# Patient Record
Sex: Female | Born: 1945 | Race: White | Hispanic: No | Smoking: Never smoker
Health system: Southern US, Community
[De-identification: ages and names within clinical notes are randomized; demographics above are authoritative.]

## PROBLEM LIST (undated history)

## (undated) DIAGNOSIS — C4490 Unspecified malignant neoplasm of skin, unspecified: Secondary | ICD-10-CM

## (undated) DIAGNOSIS — Z7982 Long term (current) use of aspirin: Secondary | ICD-10-CM

## (undated) DIAGNOSIS — G4733 Obstructive sleep apnea (adult) (pediatric): Secondary | ICD-10-CM

## (undated) DIAGNOSIS — N289 Disorder of kidney and ureter, unspecified: Secondary | ICD-10-CM

## (undated) DIAGNOSIS — T8859XA Other complications of anesthesia, initial encounter: Secondary | ICD-10-CM

## (undated) DIAGNOSIS — E7800 Pure hypercholesterolemia, unspecified: Secondary | ICD-10-CM

## (undated) DIAGNOSIS — K219 Gastro-esophageal reflux disease without esophagitis: Secondary | ICD-10-CM

## (undated) DIAGNOSIS — I2510 Atherosclerotic heart disease of native coronary artery without angina pectoris: Secondary | ICD-10-CM

## (undated) DIAGNOSIS — I700 Atherosclerosis of aorta: Secondary | ICD-10-CM

## (undated) DIAGNOSIS — C50919 Malignant neoplasm of unspecified site of unspecified female breast: Secondary | ICD-10-CM

## (undated) DIAGNOSIS — N2889 Other specified disorders of kidney and ureter: Secondary | ICD-10-CM

## (undated) DIAGNOSIS — E119 Type 2 diabetes mellitus without complications: Secondary | ICD-10-CM

## (undated) DIAGNOSIS — I10 Essential (primary) hypertension: Secondary | ICD-10-CM

## (undated) DIAGNOSIS — R7303 Prediabetes: Secondary | ICD-10-CM

## (undated) DIAGNOSIS — T884XXA Failed or difficult intubation, initial encounter: Secondary | ICD-10-CM

## (undated) DIAGNOSIS — M159 Polyosteoarthritis, unspecified: Secondary | ICD-10-CM

## (undated) DIAGNOSIS — K5792 Diverticulitis of intestine, part unspecified, without perforation or abscess without bleeding: Secondary | ICD-10-CM

## (undated) DIAGNOSIS — Z8719 Personal history of other diseases of the digestive system: Secondary | ICD-10-CM

## (undated) DIAGNOSIS — M109 Gout, unspecified: Secondary | ICD-10-CM

## (undated) DIAGNOSIS — C801 Malignant (primary) neoplasm, unspecified: Secondary | ICD-10-CM

## (undated) DIAGNOSIS — N189 Chronic kidney disease, unspecified: Secondary | ICD-10-CM

## (undated) DIAGNOSIS — G4730 Sleep apnea, unspecified: Secondary | ICD-10-CM

## (undated) HISTORY — PX: CARPAL TUNNEL RELEASE: SHX101

## (undated) HISTORY — PX: OTHER SURGICAL HISTORY: SHX169

## (undated) HISTORY — PX: MANDIBLE SURGERY: SHX707

## (undated) HISTORY — PX: CHOLECYSTECTOMY: SHX55

## (undated) HISTORY — PX: WRIST SURGERY: SHX841

## (undated) HISTORY — PX: CATARACT EXTRACTION: SUR2

## (undated) HISTORY — PX: FOOT SURGERY: SHX648

---

## 2001-02-13 ENCOUNTER — Ambulatory Visit (HOSPITAL_BASED_OUTPATIENT_CLINIC_OR_DEPARTMENT_OTHER): Admission: RE | Admit: 2001-02-13 | Discharge: 2001-02-13 | Payer: Self-pay | Admitting: Orthopedic Surgery

## 2001-02-13 ENCOUNTER — Encounter (INDEPENDENT_AMBULATORY_CARE_PROVIDER_SITE_OTHER): Payer: Self-pay | Admitting: Specialist

## 2001-12-28 ENCOUNTER — Encounter: Payer: Self-pay | Admitting: Family Medicine

## 2001-12-28 ENCOUNTER — Ambulatory Visit (HOSPITAL_COMMUNITY): Admission: RE | Admit: 2001-12-28 | Discharge: 2001-12-28 | Payer: Self-pay | Admitting: Family Medicine

## 2004-05-07 ENCOUNTER — Ambulatory Visit (HOSPITAL_COMMUNITY): Admission: RE | Admit: 2004-05-07 | Discharge: 2004-05-07 | Payer: Self-pay | Admitting: Family Medicine

## 2004-09-25 ENCOUNTER — Encounter: Admission: RE | Admit: 2004-09-25 | Discharge: 2004-09-25 | Payer: Self-pay | Admitting: Family Medicine

## 2004-11-13 ENCOUNTER — Other Ambulatory Visit: Admission: RE | Admit: 2004-11-13 | Discharge: 2004-11-13 | Payer: Self-pay | Admitting: Family Medicine

## 2005-12-17 ENCOUNTER — Other Ambulatory Visit: Admission: RE | Admit: 2005-12-17 | Discharge: 2005-12-17 | Payer: Self-pay | Admitting: Family Medicine

## 2006-11-25 ENCOUNTER — Ambulatory Visit (HOSPITAL_COMMUNITY): Admission: RE | Admit: 2006-11-25 | Discharge: 2006-11-25 | Payer: Self-pay | Admitting: Family Medicine

## 2007-04-27 ENCOUNTER — Encounter: Payer: Self-pay | Admitting: Family Medicine

## 2008-04-18 ENCOUNTER — Ambulatory Visit (HOSPITAL_COMMUNITY): Admission: RE | Admit: 2008-04-18 | Discharge: 2008-04-18 | Payer: Self-pay | Admitting: Family Medicine

## 2008-05-26 LAB — CONVERTED CEMR LAB: Pap Smear: NORMAL

## 2008-05-30 ENCOUNTER — Other Ambulatory Visit: Admission: RE | Admit: 2008-05-30 | Discharge: 2008-05-30 | Payer: Self-pay | Admitting: Family Medicine

## 2008-06-05 ENCOUNTER — Encounter: Payer: Self-pay | Admitting: Family Medicine

## 2008-06-20 ENCOUNTER — Ambulatory Visit: Payer: Self-pay

## 2008-06-20 ENCOUNTER — Encounter: Payer: Self-pay | Admitting: Family Medicine

## 2008-06-27 ENCOUNTER — Encounter: Payer: Self-pay | Admitting: Family Medicine

## 2008-07-05 ENCOUNTER — Ambulatory Visit: Payer: Self-pay | Admitting: Family Medicine

## 2008-07-05 DIAGNOSIS — K219 Gastro-esophageal reflux disease without esophagitis: Secondary | ICD-10-CM | POA: Insufficient documentation

## 2008-07-05 DIAGNOSIS — I10 Essential (primary) hypertension: Secondary | ICD-10-CM | POA: Insufficient documentation

## 2008-07-19 ENCOUNTER — Telehealth: Payer: Self-pay | Admitting: Family Medicine

## 2008-08-22 ENCOUNTER — Ambulatory Visit: Payer: Self-pay | Admitting: Family Medicine

## 2008-11-15 ENCOUNTER — Ambulatory Visit: Payer: Self-pay | Admitting: Family Medicine

## 2008-11-15 DIAGNOSIS — B351 Tinea unguium: Secondary | ICD-10-CM | POA: Insufficient documentation

## 2008-12-20 ENCOUNTER — Ambulatory Visit: Payer: Self-pay | Admitting: Family Medicine

## 2008-12-20 DIAGNOSIS — E039 Hypothyroidism, unspecified: Secondary | ICD-10-CM | POA: Insufficient documentation

## 2008-12-21 LAB — CONVERTED CEMR LAB
ALT: 22 units/L (ref 0–35)
AST: 27 units/L (ref 0–37)
Albumin: 3.9 g/dL (ref 3.5–5.2)
Alkaline Phosphatase: 87 units/L (ref 39–117)
BUN: 16 mg/dL (ref 6–23)
Bilirubin, Direct: 0.1 mg/dL (ref 0.0–0.3)
Calcium: 9 mg/dL (ref 8.4–10.5)
Chloride: 103 meq/L (ref 96–112)
Cholesterol: 141 mg/dL (ref 0–200)
Creatinine, Ser: 1 mg/dL (ref 0.4–1.2)
GFR calc non Af Amer: 59.26 mL/min (ref >60)
LDL Cholesterol: 85 mg/dL (ref 0–99)
TSH: 2.43 microintl units/mL (ref 0.35–5.50)
Total Protein: 6.6 g/dL (ref 6.0–8.3)
Triglycerides: 129 mg/dL (ref 0.0–149.0)

## 2009-01-17 ENCOUNTER — Telehealth: Payer: Self-pay | Admitting: Family Medicine

## 2009-02-14 ENCOUNTER — Ambulatory Visit: Payer: Self-pay | Admitting: Family Medicine

## 2009-02-14 DIAGNOSIS — L988 Other specified disorders of the skin and subcutaneous tissue: Secondary | ICD-10-CM | POA: Insufficient documentation

## 2009-03-02 ENCOUNTER — Telehealth: Payer: Self-pay | Admitting: Family Medicine

## 2009-03-28 ENCOUNTER — Ambulatory Visit: Payer: Self-pay | Admitting: Family Medicine

## 2009-03-28 DIAGNOSIS — L989 Disorder of the skin and subcutaneous tissue, unspecified: Secondary | ICD-10-CM | POA: Insufficient documentation

## 2009-03-28 DIAGNOSIS — F4321 Adjustment disorder with depressed mood: Secondary | ICD-10-CM | POA: Insufficient documentation

## 2009-03-28 DIAGNOSIS — R4589 Other symptoms and signs involving emotional state: Secondary | ICD-10-CM | POA: Insufficient documentation

## 2009-03-31 ENCOUNTER — Encounter: Payer: Self-pay | Admitting: Family Medicine

## 2009-04-28 ENCOUNTER — Telehealth: Payer: Self-pay | Admitting: Family Medicine

## 2009-07-23 ENCOUNTER — Ambulatory Visit: Payer: Self-pay | Admitting: Internal Medicine

## 2009-09-19 ENCOUNTER — Ambulatory Visit: Payer: Self-pay | Admitting: Family Medicine

## 2009-09-21 ENCOUNTER — Ambulatory Visit: Payer: Self-pay

## 2009-09-22 ENCOUNTER — Encounter: Payer: Self-pay | Admitting: Family Medicine

## 2009-09-27 ENCOUNTER — Ambulatory Visit: Payer: Self-pay | Admitting: Podiatry

## 2009-10-02 ENCOUNTER — Encounter: Payer: Self-pay | Admitting: Family Medicine

## 2009-10-05 ENCOUNTER — Telehealth: Payer: Self-pay | Admitting: Family Medicine

## 2009-10-09 ENCOUNTER — Encounter: Payer: Self-pay | Admitting: Family Medicine

## 2009-10-25 ENCOUNTER — Encounter: Payer: Self-pay | Admitting: Family Medicine

## 2009-11-17 ENCOUNTER — Ambulatory Visit: Payer: Self-pay | Admitting: Family Medicine

## 2009-11-17 ENCOUNTER — Other Ambulatory Visit: Admission: RE | Admit: 2009-11-17 | Discharge: 2009-11-17 | Payer: Self-pay | Admitting: Family Medicine

## 2009-11-17 DIAGNOSIS — M109 Gout, unspecified: Secondary | ICD-10-CM | POA: Insufficient documentation

## 2009-11-21 ENCOUNTER — Telehealth: Payer: Self-pay | Admitting: Family Medicine

## 2009-11-24 ENCOUNTER — Encounter (INDEPENDENT_AMBULATORY_CARE_PROVIDER_SITE_OTHER): Payer: Self-pay | Admitting: *Deleted

## 2009-11-24 LAB — CONVERTED CEMR LAB: Pap Smear: NEGATIVE

## 2009-11-28 ENCOUNTER — Telehealth (INDEPENDENT_AMBULATORY_CARE_PROVIDER_SITE_OTHER): Payer: Self-pay | Admitting: *Deleted

## 2009-11-28 ENCOUNTER — Ambulatory Visit: Payer: Self-pay | Admitting: Family Medicine

## 2009-11-28 ENCOUNTER — Encounter: Payer: Self-pay | Admitting: Family Medicine

## 2009-12-06 ENCOUNTER — Encounter (INDEPENDENT_AMBULATORY_CARE_PROVIDER_SITE_OTHER): Payer: Self-pay | Admitting: *Deleted

## 2009-12-12 ENCOUNTER — Telehealth: Payer: Self-pay | Admitting: Family Medicine

## 2009-12-26 ENCOUNTER — Ambulatory Visit: Payer: Self-pay | Admitting: Family Medicine

## 2010-01-02 LAB — CONVERTED CEMR LAB
ALT: 24 units/L (ref 0–35)
AST: 23 units/L (ref 0–37)
Albumin: 4 g/dL (ref 3.5–5.2)
Alkaline Phosphatase: 73 units/L (ref 39–117)
BUN: 19 mg/dL (ref 6–23)
CO2: 30 meq/L (ref 19–32)
Chloride: 104 meq/L (ref 96–112)
GFR calc non Af Amer: 62.68 mL/min (ref >60)
Glucose, Bld: 93 mg/dL (ref 70–99)
HDL: 36.5 mg/dL — ABNORMAL LOW (ref >39.00)
Potassium: 4 meq/L (ref 3.5–5.1)
Sodium: 143 meq/L (ref 135–145)
TSH: 3.38 microintl units/mL (ref 0.35–5.50)

## 2010-01-03 ENCOUNTER — Telehealth: Payer: Self-pay | Admitting: Family Medicine

## 2010-02-09 ENCOUNTER — Ambulatory Visit: Payer: Self-pay | Admitting: Family Medicine

## 2010-02-09 DIAGNOSIS — IMO0002 Reserved for concepts with insufficient information to code with codable children: Secondary | ICD-10-CM | POA: Insufficient documentation

## 2010-02-16 ENCOUNTER — Telehealth: Payer: Self-pay | Admitting: Family Medicine

## 2010-02-26 ENCOUNTER — Ambulatory Visit: Payer: Self-pay | Admitting: Family Medicine

## 2010-02-26 DIAGNOSIS — M25569 Pain in unspecified knee: Secondary | ICD-10-CM

## 2010-04-03 ENCOUNTER — Telehealth: Payer: Self-pay | Admitting: Family Medicine

## 2010-04-05 ENCOUNTER — Ambulatory Visit: Payer: Self-pay | Admitting: Family Medicine

## 2010-04-05 DIAGNOSIS — E7800 Pure hypercholesterolemia, unspecified: Secondary | ICD-10-CM | POA: Insufficient documentation

## 2010-04-06 ENCOUNTER — Telehealth: Payer: Self-pay | Admitting: Family Medicine

## 2010-04-06 ENCOUNTER — Encounter (INDEPENDENT_AMBULATORY_CARE_PROVIDER_SITE_OTHER): Payer: Self-pay | Admitting: *Deleted

## 2010-04-10 LAB — CONVERTED CEMR LAB
Alkaline Phosphatase: 71 units/L (ref 39–117)
Bilirubin, Direct: 0.1 mg/dL (ref 0.0–0.3)
Calcium: 10.3 mg/dL (ref 8.4–10.5)
Chloride: 102 meq/L (ref 96–112)
Cholesterol: 229 mg/dL — ABNORMAL HIGH (ref 0–200)
Creatinine, Ser: 1 mg/dL (ref 0.4–1.2)
Direct LDL: 144 mg/dL
Sodium: 140 meq/L (ref 135–145)
Total Bilirubin: 0.7 mg/dL (ref 0.3–1.2)
Total CHOL/HDL Ratio: 6
Triglycerides: 268 mg/dL — ABNORMAL HIGH (ref 0.0–149.0)
VLDL: 53.6 mg/dL — ABNORMAL HIGH (ref 0.0–40.0)

## 2010-04-12 ENCOUNTER — Telehealth: Payer: Self-pay | Admitting: Family Medicine

## 2010-04-25 ENCOUNTER — Encounter: Payer: Self-pay | Admitting: Family Medicine

## 2010-05-14 ENCOUNTER — Ambulatory Visit: Payer: Self-pay | Admitting: Family Medicine

## 2010-05-14 DIAGNOSIS — M5430 Sciatica, unspecified side: Secondary | ICD-10-CM | POA: Insufficient documentation

## 2010-05-14 DIAGNOSIS — M5431 Sciatica, right side: Secondary | ICD-10-CM | POA: Insufficient documentation

## 2010-05-15 LAB — CONVERTED CEMR LAB: Uric Acid, Serum: 6.4 mg/dL (ref 2.4–7.0)

## 2010-08-19 HISTORY — PX: OTHER SURGICAL HISTORY: SHX169

## 2010-08-27 ENCOUNTER — Telehealth: Payer: Self-pay | Admitting: Family Medicine

## 2010-08-28 ENCOUNTER — Telehealth: Payer: Self-pay | Admitting: Family Medicine

## 2010-09-05 ENCOUNTER — Telehealth: Payer: Self-pay | Admitting: Family Medicine

## 2010-09-20 NOTE — Progress Notes (Signed)
Summary: new dosing guidlines for colcrys  Phone Note Call from Patient   Caller: Pharmacy- [phone] Call For: Theresa Nora MD Summary of Call:  pharmacy states that new dosing guidlines avised are 2 tabs by mouth then follow w/ 1 tab one hour later not to repeat every 2 hours. Pharmacy wants to know if you want to change directions. Please advise.  Initial call taken by: Melody Comas,  August 28, 2010 3:47 PM  Follow-up for Phone Call        Yes please change directions.[name] also change in EMR.  Follow-up by: Theresa Nora MD,  August 28, 2010 5:06 PM  Additional Follow-up for Phone Call Additional follow up Details #1::        all taken care off.[name] CMA   Additional Follow-up by: Benny Lennert CMA Duncan Dull),  August 28, 2010 5:09 PM    New/Updated Medications: COLCRYS 0.6 MG TABS (COLCHICINE) 2 tabs by mouth now, then 1 by hour  Prior Medications: HYDROCHLOROTHIAZIDE 25 MG TABS (HYDROCHLOROTHIAZIDE) Take 1 tablet by mouth once a day LISINOPRIL 10 MG TABS (LISINOPRIL) Take 1 tablet by mouth once a day CELEBREX 200 MG CAPS (CELECOXIB) 1 tab by mouth daily MULTIVITAMINS   TABS (MULTIPLE VITAMIN) Take 1 tablet by mouth once a day VITAMIN D 1000 UNIT  TABS (CHOLECALCIFEROL) 2000 i.u. once daily VITAMIN B-12 500 MCG  TABS (CYANOCOBALAMIN) 1000 mg. once daily [facility] STOOL SOFTENER/LAXATIVE 8.6-50 MG TABS (SENNOSIDES-DOCUSATE SODIUM) Take 2  tablets by mouth once a day MUCINEX DM MAXIMUM STRENGTH 60-1200 MG XR12H-TAB (DEXTROMETHORPHAN-GUAIFENESIN) Take 1 tablet by mouth once a day as needed. TERBINAFINE HCL 250 MG TABS (TERBINAFINE HCL) 1 tab by mouth daily x 12 weeks ALLOPURINOL 300 MG TABS (ALLOPURINOL) Take 1 tablet by mouth once a day FISH OIL 1000 MG CAPS (OMEGA-3 FATTY ACIDS) 4000mg  divided daily CYCLOBENZAPRINE HCL 10 MG TABS (CYCLOBENZAPRINE HCL) 1/2 to 1 tab by mouth at bedtime as needed muscle spasm GABAPENTIN 300 MG CAPS (GABAPENTIN) 1 by mouth three times a  day Current Allergies: No known allergies

## 2010-09-20 NOTE — Progress Notes (Signed)
Summary: colcrys  Phone Note Refill Request Message from:  [employer] on August 27, 2010 9:17 AM  Refills Requested: Medication #1:  colcrys 0.6   Supply Requested: 1 month   Notes: 2 tabs by mouth now, then 1 by mouth q 2 hours until pain improved, then two times a day until pain resolution [facility] [address]   Method Requested: Electronic Initial call taken by: Benny Lennert CMA Duncan Dull),  August 27, 2010 9:19 AM  Follow-up for Phone Call        Okay to refill as requested.. please enter in EMR.  Follow-up by: Kerby Nora MD,  August 27, 2010 10:52 PM    New/Updated Medications: COLCRYS 0.6 MG TABS (COLCHICINE) 2 tabs by mouth now, then 1 by mouth q 2 hours until pain improved, then two times a day until pain resolution Prescriptions: COLCRYS 0.6 MG TABS (COLCHICINE) 2 tabs by mouth now, then 1 by mouth q 2 hours until pain improved, then two times a day until pain resolution  #30 x 0   Entered by:   Benny Lennert CMA (AAMA)   Authorized by:   Kerby Nora MD   Signed by:   Benny Lennert CMA (AAMA) on 08/28/2010   Method used:   Electronically to        [facility] [address] [address] (retail)       [address]       [place], [place]  [address]       Ph: [phone]       Fax: [phone]   RxID:   [id number]   Prior Medications: HYDROCHLOROTHIAZIDE 25 MG TABS (HYDROCHLOROTHIAZIDE) Take 1 tablet by mouth once a day LISINOPRIL 10 MG TABS (LISINOPRIL) Take 1 tablet by mouth once a day CELEBREX 200 MG CAPS (CELECOXIB) 1 tab by mouth daily MULTIVITAMINS   TABS (MULTIPLE VITAMIN) Take 1 tablet by mouth once a day VITAMIN D 1000 UNIT  TABS (CHOLECALCIFEROL) 2000 i.u. once daily VITAMIN B-12 500 MCG  TABS (CYANOCOBALAMIN) 1000 mg. once daily [facility] STOOL SOFTENER/LAXATIVE 8.6-50 MG TABS (SENNOSIDES-DOCUSATE SODIUM) Take 2  tablets by mouth once a day MUCINEX DM MAXIMUM STRENGTH 60-1200 MG XR12H-TAB (DEXTROMETHORPHAN-GUAIFENESIN) Take 1 tablet by  mouth once a day as needed. TERBINAFINE HCL 250 MG TABS (TERBINAFINE HCL) 1 tab by mouth daily x 12 weeks ALLOPURINOL 300 MG TABS (ALLOPURINOL) Take 1 tablet by mouth once a day FISH OIL 1000 MG CAPS (OMEGA-3 FATTY ACIDS) 4000mg  divided daily CYCLOBENZAPRINE HCL 10 MG TABS (CYCLOBENZAPRINE HCL) 1/2 to 1 tab by mouth at bedtime as needed muscle spasm GABAPENTIN 300 MG CAPS (GABAPENTIN) 1 by mouth three times a day Current Allergies: No known allergies

## 2010-09-20 NOTE — Assessment & Plan Note (Signed)
Summary: HURTING IN BACK AND DOWN LEGS   Vital Signs:  Patient profile:   65 year old female Weight:      189 pounds Temp:     98.4 degrees F oral Pulse rate:   84 / minute Pulse rhythm:   regular BP sitting:   118 / 76  (right arm) Cuff size:   regular  Vitals Entered By: Lowella Petties CMA (February 09, 2010 10:50 AM) CC: Low back pain, radiating down right leg x 3 days, Back Pain   History of Present Illness: 65 year old female:  Right sided radiculopathy Has been having some intermittent back pain for a longg   Planted a tree.    Back Pain History:      The patient's back pain has been present for < 6 weeks.  The pain is located in the lower back region and does radiate below the knees.  She states this is not work related.  She states that she has had a prior history of back pain.  The patient has not had any recent physical therapy for her back pain.  The following makes the back pain better: stretching, heating pad.  The following makes the back pain worse: flexion and elevation of leg.        Description of injury in patient's own words:  none.    Critical Exclusionary Diagnosis Criteria (CEDC) for Back Pain:      The patient denies a history of previous trauma.  She has no prior history of spinal surgery.  There are no symptoms to suggest cauda equina or psychosocial factors for back pain.  Cancer risk factors include age >50 yrs with new back pain.    Allergies: No Known Drug Allergies  Past History:  Past medical, surgical, family and social histories (including risk factors) reviewed, and no changes noted (except as noted below).  Past Medical History: Reviewed history from 07/05/2008 and no changes required. GERD Hypertension  Past Surgical History: Reviewed history from 07/05/2008 and no changes required. facial/jaw cyst surgery x 4 1990 Cholecystectomy  Family History: Reviewed history from 07/05/2008 and no changes required. father: CVA, CAD s/p stents  no early MI, high chol mother:ovarian cancer, HTN sister: DM, HTN PGF: DM PGM: CAD MGM: CAD MGF: CAD  Social History: Reviewed history from 03/28/2009 and no changes required. 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    Physical Exam  General:  GEN: Well-developed,well-nourished,in no acute distress; alert,appropriate and cooperative throughout examination HEENT: Normocephalic and atraumatic without obvious abnormalities. No apparent alopecia or balding. Ears, externally no deformities PULM: Breathing comfortably in no respiratory distress EXT: No clubbing, cyanosis, or edema PSYCH: Normally interactive. Cooperative during the interview. Pleasant. Friendly and conversant. Not anxious or depressed appearing. Normal, full affect.  Msk:  Normal Greater trochanteric bursae Full hip ROM Negative Pearlean Brownie + Reverse Faber Sciatic Notches: nt Sensation to [employer] WNL Sensation to pinpricnk WNL DTR 2+ knee and ankle no clonus DP and PT pulses are normal B   Low Back Pain Physical Exam:    Inspection-deformity:  No    Palpation-spinal tenderness:   No    Motor Exam/Strength:         Left Ankle Dorsiflexion (L5,L4):     normal       Left Great Toe Dorsiflexion (L5,L4):     normal       Left Heel Walk (L5,some L4):     normal       Left Single Squat & Rise-Quads (L4):   normal       Left Toe Walk-calf (S1):       normal       Right Ankle Dorsiflexion (L5,L4):     normal       Right Great Toe Dorsiflexion (L5,L4):       normal       Right Heel Walk  (L5,some L4):     normal       Right Single Squat & Rise Quads (L4):   normal       Right Toe Walk-calf (S1):       normal    Sensory Exam/Pinprick:        Left Medial Foot (L4):   normal       Left Dorsal Foot (L5):   normal       Left Lateral Foot (S1):   normal       Right Medial Foot (L4):   normal       Right Dorsal Foot (L5):   normal       Right Lateral Foot (S1):   normal    Reflexes:        Left Knee Jerk (L4):     normal       Left Ankle Reflex (S1):   normal       Right Knee Jerk:     normal       Right Ankle Reflex (S1):   normal    Straight Leg Raise (SLR):       Left Straight Leg Raise (SLR):   negative       Right Straight Leg Raise (SLR):   negative   Impression & Recommendations:  Problem # 1:  BACK PAIN, LUMBAR, WITH RADICULOPATHY (ICD-724.4) Assessment New suspect discogenic vs. small spur with foraminal impingement on R  cont celebrex, zanaflex, tramadol as needed  heating pad massage  f/u if not better  Her updated medication list for this problem includes:    Celebrex 200 Mg Caps (Celecoxib) .[name]... 1 tab by mouth daily    Aspirin 81 Mg Tabs (Aspirin) .[name]... Take 1 tablet by mouth once a day    Tizanidine Hcl 4 Mg Tabs (Tizanidine hcl) .[name]... 1 by mouth at bedtime as needed back    Tramadol Hcl 50 Mg Tabs (Tramadol hcl) .[name]... 1 by mouth 4 times daily as needed pain  Complete Medication List: 1)  Hydrochlorothiazide 25 Mg Tabs (Hydrochlorothiazide) .... Take 1 tablet by mouth once a day 2)  Lisinopril 10 Mg Tabs (Lisinopril) .... Take 1 tablet by mouth once a day 3)  Celebrex 200 Mg Caps (Celecoxib) .[name].. 1 tab by mouth daily 4)  Aspirin 81 Mg Tabs (Aspirin) .... Take 1 tablet by mouth once a day 5)  Multivitamins Tabs (Multiple vitamin) .... Take 1 tablet by mouth once a day 6)  Calcium Carbonate-vitamin D 600-400 Mg-unit Tabs (Calcium carbonate-vitamin d) .... Take 1 tablet by mouth once a day 7)  Vitamin D 1000 Unit Tabs (Cholecalciferol) .... 2000 i.u.  once daily 8)  Vitamin B-12 500 Mcg  Tabs (Cyanocobalamin) .[name].. 1000 mg. once daily 9)  [facility] Stool Softener/laxative 8.6-50 Mg Tabs (Sennosides-docusate sodium) .... Take 2  tablets by mouth once a day 10)  Mucinex Dm Maximum Strength 60-1200 Mg Xr12h-tab (Dextromethorphan-guaifenesin) .... Take 1 tablet by mouth once a day as needed. 11)  Terbinafine Hcl 250 Mg Tabs (Terbinafine hcl) .[name].. 1 tab by mouth daily x 12 weeks 12)  Omega Red 300mg   .... Take one tablet by mouth daily 13)  Colcrys 0.6 Mg Tabs (Colchicine) .... 2 tab by mouth x 1 then 1 tab by mouth every 2 hpours until paingone, have diarrhea or reach max of 10 pills in 24 hours. 14)  Allopurinol 100 Mg Tabs (Allopurinol) .[name].. 1 by mouth daily 15)  Tizanidine Hcl 4 Mg Tabs (Tizanidine hcl) .[name].. 1 by mouth at bedtime as needed back 16)  Tramadol Hcl 50 Mg Tabs (Tramadol hcl) .[name].. 1 by mouth 4 times daily as needed pain Prescriptions: TRAMADOL HCL 50 MG  TABS (TRAMADOL HCL) 1 by mouth 4 times daily as needed pain  #40 x 0   Entered and Authorized by:   Hannah Beat MD   Signed by:   Hannah Beat MD on 02/09/2010   Method used:   Electronically to        [facility] (retail)       [address]       [place], [place]  [address]       Ph: [phone]       Fax: [phone]   RxID:   [id number] TIZANIDINE HCL 4 MG TABS (TIZANIDINE HCL) 1 by mouth at bedtime as needed back  #30 x 1   Entered and Authorized by:   Hannah Beat MD   Signed by:   Hannah Beat MD on 02/09/2010   Method used:   Electronically to        [facility] (retail)       [address]       [place], [place]  [address]       Ph: [phone]       Fax: [phone]   RxID:   [id number]   Prior Medications (reviewed today): HYDROCHLOROTHIAZIDE 25 MG TABS (HYDROCHLOROTHIAZIDE) Take 1 tablet by mouth once a day LISINOPRIL 10 MG TABS (LISINOPRIL) Take 1 tablet by mouth once a  day CELEBREX 200 MG CAPS (CELECOXIB) 1 tab by mouth daily ASPIRIN 81 MG  TABS (ASPIRIN) Take 1 tablet by mouth once a day MULTIVITAMINS   TABS (MULTIPLE VITAMIN) Take 1 tablet by mouth once a day CALCIUM CARBONATE-VITAMIN D 600-400 MG-UNIT  TABS (CALCIUM CARBONATE-VITAMIN D) Take 1 tablet by mouth once a day VITAMIN D 1000 UNIT  TABS (CHOLECALCIFEROL) 2000 i.u. once daily VITAMIN B-12 500 MCG  TABS (CYANOCOBALAMIN) 1000 mg. once daily [facility] STOOL SOFTENER/LAXATIVE 8.6-50 MG TABS (SENNOSIDES-DOCUSATE SODIUM) Take 2  tablets by mouth once a day MUCINEX DM MAXIMUM STRENGTH 60-1200 MG XR12H-TAB (DEXTROMETHORPHAN-GUAIFENESIN) Take 1 tablet by mouth once a day as needed. TERBINAFINE HCL 250 MG TABS (TERBINAFINE HCL) 1 tab by mouth daily x 12 weeks OMEGA RED 300MG  () take one tablet by mouth daily COLCRYS 0.6 MG TABS (COLCHICINE) 2 tab by mouth x 1 then 1 tab by mouth every 2 hpours until paingone, have diarrhea or reach max of 10 pills in 24 hours. ALLOPURINOL 100 MG TABS (ALLOPURINOL) 1 by  mouth daily Current Allergies: No known allergies

## 2010-09-20 NOTE — Letter (Signed)
Summary: [facility]   Imported By: Lanelle Bal 11/29/2009 12:19:35  _____________________________________________________________________  External Attachment:    Type:   Image     Comment:   External Document

## 2010-09-20 NOTE — Progress Notes (Signed)
Summary: Clarification for Celebrex and Indomethacin  Phone Note From Pharmacy   Caller: [facility]* Call For: Dr. Ermalene Searing  Summary of Call: Received a fax form from [facility] requesting clarification on Celebrex and Indomethacin.  Form in your IN box.  Please advise. Initial call taken by: Linde Gillis CMA [facility]),  November 21, 2009 11:55 AM

## 2010-09-20 NOTE — Assessment & Plan Note (Signed)
Summary: SWOLLEN RIGHT KNEE/CLE   Vital Signs:  Patient profile:   65 year old female Weight:      190 pounds Temp:     98.5 degrees F oral Pulse rate:   76 / minute Pulse rhythm:   regular BP sitting:   142 / 78  (left arm) Cuff size:   regular  Vitals Entered By: Lowella Petties CMA (February 26, 2010 9:05 AM) CC: Right knee is swollen   History of Present Illness: 65 year old with R knee pain:  Has an effusion.   the patient is a pleasant 64 year old female with a history of gout presents with right knee it has been there barely getting worse over the last 2 weeks. She has no inciting event or injury or trauma. No mechanical symptoms, no symptomatic giving way, the patient does have a large effusion. No mechanical buckling. Patient is having difficulty going up and down stairs  REVIEW OF SYSTEMS  GEN: No systemic complaints, no fevers, chills, sweats, or other acute illnesses MSK: Detailed in the HPI GI: tolerating PO intake without difficulty Neuro: No numbness, parasthesias, or tingling associated. Otherwise the pertinent positives of the ROS are noted above.    GEN: Well-developed,well-nourished,in no acute distress; alert,appropriate and cooperative throughout examination HEENT: Normocephalic and atraumatic without obvious abnormalities. No apparent alopecia or balding. Ears, externally no deformities PULM: Breathing comfortably in no respiratory distress EXT: No clubbing, cyanosis, or edema PSYCH: Normally interactive. Cooperative during the interview. Pleasant. Friendly and conversant. Not anxious or depressed appearing. Normal, full affect.   right knee: Full extension, mildly tender on the medial joint line.  Patellar mobility is poor. Otherwise bony anatomy is nontender throughout. Stable MCL and LCL. Negative Lachman exam and negative anterior and posterior drawer exam.  Large ballotable effusion. Flexion is limited to approximately 95 and all other meniscal pathology  testing is equivocal given large effusion.  Allergies: No Known Drug Allergies  Past History:  Past medical, surgical, family and social histories (including risk factors) reviewed, and no changes noted (except as noted below).  Past Medical History: GERD Hypertension Gout  Past Surgical History: Reviewed history from 07/05/2008 and no changes required. facial/jaw cyst surgery x 4 1990 Cholecystectomy  Family History: Reviewed history from 07/05/2008 and no changes required. father: CVA, CAD s/p stents no early MI, high chol mother:ovarian cancer, HTN sister: DM, HTN PGF: DM PGM: CAD MGM: CAD MGF: CAD  Social History: Reviewed history from 03/28/2009 and no changes required. 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  The patient could have flared  some osteoarthritis given her recent back pain, however  certainly the joint aspirate is consistent with gouty pathology. When she becomes asymptomatic, I would increase her allopurinol. For now, celebrex, ice, colchicine, knee aspiration, and injection  Knee Aspiration and Injection, R Patient verbally consented; risks, benefits, and alternatives explained. Patient prepped with betadine. Ethyl chloride for anesthesia. 10 cc of 1% Lidocaine used in wheal then injected Subcutaneous fashion with 27 gauge needle on lateral approach. Under sterilne conditions, 18 gauge needle used via lateral approach to aspirate 40 cc of yellow, cloudy fluid. Then 9 cc of Marcaine 0.5% and 1 cc of Kenalog 40 mg injected. Tolerated well, decreased pain,  no complications.   Her updated medication list for this problem  includes:    Celebrex 200 Mg Caps (Celecoxib) .[name]... 1 tab by mouth daily    Colcrys 0.6 Mg Tabs (Colchicine) .[name]... 2 tab by mouth x 1 then 1 tab by mouth every 2 hpours until paingone, have diarrhea or reach max of 10 pills in 24 hours.    Allopurinol 100 Mg Tabs (Allopurinol) .[name]... 1 by mouth daily    Colcrys 0.6 Mg Tabs (Colchicine) .[name]... 2 tabs by mouth now, then 1 by mouth q 2 hours until pain improved, then two times a day until pain resolution  Orders: Joint Aspirate / Injection, Large (20610) Kenalog 10mg  (4units) (J3301)  Problem # 2:  KNEE PAIN, RIGHT (BJY-782.95) Assessment: New  Her updated medication list for this problem includes:    Celebrex 200 Mg Caps (Celecoxib) .[name]... 1 tab by mouth daily    Aspirin 81 Mg Tabs (Aspirin) .[name]... Take 1 tablet by mouth once a day    Tizanidine Hcl 4 Mg Tabs (Tizanidine hcl) .[name]... 1 by mouth at bedtime as needed back    Tramadol Hcl 50 Mg Tabs (Tramadol hcl) .[name]... 1 by mouth 4 times daily as needed pain  Orders: Joint Aspirate / Injection, Large (20610) Kenalog 10mg  (4units) (J3301)  Complete Medication List: 1)  Hydrochlorothiazide 25 Mg Tabs (Hydrochlorothiazide) .... Take 1 tablet by mouth once a day 2)  Lisinopril 10 Mg Tabs (Lisinopril) .... Take 1 tablet by mouth once a day 3)  Celebrex 200 Mg Caps (Celecoxib) .[name].. 1 tab by mouth daily 4)  Aspirin 81 Mg Tabs (Aspirin) .... Take 1 tablet by mouth once a day 5)  Multivitamins Tabs (Multiple vitamin) .... Take 1 tablet by mouth once a day 6)  Calcium Carbonate-vitamin D 600-400 Mg-unit Tabs (Calcium carbonate-vitamin d) .... Take 1 tablet by mouth once a day 7)  Vitamin D 1000 Unit Tabs (Cholecalciferol) .... 2000 i.u. once daily 8)  Vitamin B-12 500 Mcg Tabs (Cyanocobalamin) .[name].. 1000 mg. once daily 9)  Cvs Stool Softener/laxative 8.6-50 Mg Tabs (Sennosides-docusate sodium) .... Take 2  tablets by mouth once a day 10)  Mucinex Dm Maximum Strength 60-1200 Mg Xr12h-tab  (Dextromethorphan-guaifenesin) .... Take 1 tablet by mouth once a day as needed. 11)  Terbinafine Hcl 250 Mg Tabs (Terbinafine hcl) .[name].. 1 tab by mouth daily x 12 weeks 12)  Omega Red 300mg   .... Take one tablet by mouth daily 13)  Colcrys 0.6 Mg Tabs (Colchicine) .... 2 tab by mouth x 1 then 1 tab by mouth every 2 hpours until paingone, have diarrhea or reach max of 10 pills in 24 hours. 14)  Allopurinol 100 Mg Tabs (Allopurinol) .[name].. 1 by mouth daily 15)  Tizanidine Hcl 4 Mg Tabs (Tizanidine hcl) .[name].. 1 by mouth at bedtime as needed back 16)  Tramadol Hcl 50 Mg Tabs (Tramadol hcl) .[name].. 1 by mouth 4 times daily as needed pain 17)  Colcrys 0.6 Mg Tabs (Colchicine) .... 2 tabs by mouth now, then 1 by mouth q 2 hours until pain improved, then two times a day until pain resolution Prescriptions: COLCRYS 0.6 MG TABS (COLCHICINE) 2 tabs by mouth now, then 1 by mouth q 2 hours until pain improved, then two times a day until pain resolution  #30 x 0   Entered and Authorized by:   Hannah Beat MD   Signed by:   Hannah Beat MD on 02/26/2010   Method used:   Print then Give  to Patient   RxID:   [id number]

## 2010-09-20 NOTE — Progress Notes (Signed)
Summary: regarding gout meds  Phone Note Call from Patient   Caller: Patient Call For: Dr. Patsy Lager Summary of Call: Pt states she is over the gout flare up in her left foot.  She has been taking colcrys for the flare, as well as allopurinol every day.  She is asking if we were going to change her gout medicines, pharmacist told her she can take colcrys everyday to prevent flares.  Please advise.  I told her I didnt think she was to take allorpurinol during flares, but I would ask.  [facility]ses [facility] [place]. Initial call taken by: Lowella Petties CMA,  April 12, 2010 3:00 PM  Follow-up for Phone Call        Do not alter allopurinol or d/c during flares  I would recommend increasing allopurinol vs. uloric if at baseline and asymptomatic - continuity, i would rather have Dr. B make this decision with her pt. Follow-up by: Hannah Beat MD,  April 12, 2010 3:11 PM  Additional Follow-up for Phone Call Additional follow up Details #1::        Advised pt, will send note to Dr. Ermalene Searing, pt knows she is out of the office until tuesday.           Lowella Petties CMA  April 12, 2010 3:16 PM     Additional Follow-up for Phone Call Additional follow up Details #2::    The main question is...is she still ahving a flare???? Her  uric acid is very high. Once gout flare is over we will need to increase allopurinol, but not until she is completely assymptomatic. Please let me know whther fare over or not. Follow-up by: Kerby Nora MD,  April 12, 2010 11:01 PM  Additional Follow-up for Phone Call Additional follow up Details #3:: Details for Additional Follow-up Action Taken: Patient says gout flare completely gone now would [place] [facility] CMA Duncan Dull),  April 13, 2010 8:13 AM  Sorry missed that in all the notes, but saw it on second read.  HAve her continue colchrys and increase allopurinol to 300 mg daily.Marland KitchenMarland KitchenI will send in new rx. Recheck uric acid in 4-6 weeks. Dx 274.9  patient  advised and lab appt made Additional Follow-up by: Benny Lennert CMA Duncan Dull),  April 16, 2010 3:41 PM  New/Updated Medications: ALLOPURINOL 300 MG TABS (ALLOPURINOL) Take 1 tablet by mouth once a day Prescriptions: ALLOPURINOL 300 MG TABS (ALLOPURINOL) Take 1 tablet by mouth once a day  #30 x 11   Entered and Authorized by:   Kerby Nora MD   Signed by:   Kerby Nora MD on 04/16/2010   Method used:   Electronically to        [facility] (retail)       [address]       [place], [place]  [address]       Ph: [phone]       Fax: [phone]   RxID:   [id number]

## 2010-09-20 NOTE — Assessment & Plan Note (Signed)
Summary: CLEARANCE FOR FOOT SURGERY/CLE   Vital Signs:  Patient profile:   65 year old female Height:      63 inches Weight:      190.2 pounds BMI:     33.81 Temp:     98.1 degrees F oral Pulse rate:   80 / minute Pulse rhythm:   regular BP sitting:   120 / 76  (left arm) Cuff size:   regular  Vitals Entered By: Benny Lennert CMA Duncan Dull) (September 19, 2009 8:53 AM)  History of Present Illness: Chief complaint clearance for foot surgery  PREOP EVAL:   Next week has procedure planned for skin lesion biopsy and removal on left great toe. Podiatrist was unsure what area was as well. At [facility] hospital... not clearly going to have intubation.   HTN well controled at home.     Problems Prior to Update: 1)  Mourning  (ICD-309.0) 2)  Skin Lesion  (ICD-709.9) 3)  Other Specified Disorder of Skin  (ICD-709.8) 4)  Unspecified Hypothyroidism  (ICD-244.9) 5)  Screening For Lipoid Disorders  (ICD-V77.91) 6)  Onychomycosis, Bilateral  (ICD-110.1) 7)  Hypertension  (ICD-401.9) 8)  Gerd  (ICD-530.81) 9)  ? of Rheumatoid Arthritis  (ICD-714.0)  Current Medications (verified): 1)  Hydrochlorothiazide 25 Mg Tabs (Hydrochlorothiazide) .... Take 1 Tablet By Mouth Once A Day 2)  Lisinopril 10 Mg Tabs (Lisinopril) .... Take 1 Tablet By Mouth Once A Day 3)  Celebrex 200 Mg Caps (Celecoxib) .[name].. 1 Tab By Mouth Daily 4)  Aspirin 81 Mg  Tabs (Aspirin) .... Take 1 Tablet By Mouth Once A Day 5)  Multivitamins   Tabs (Multiple Vitamin) .... Take 1 Tablet By Mouth Once A Day 6)  Calcium Carbonate-Vitamin D 600-400 Mg-Unit  Tabs (Calcium Carbonate-Vitamin D) .... Take 1 Tablet By Mouth Once A Day 7)  Vitamin D 1000 Unit  Tabs (Cholecalciferol) .... 2000 I.u. Once Daily 8)  Vitamin B-12 500 Mcg  Tabs (Cyanocobalamin) .[name].. 1000 Mg. Once Daily 9)  Cvs Stool Softener/laxative 8.6-50 Mg Tabs (Sennosides-Docusate Sodium) .... Take 2  Tablets By Mouth Once A Day 10)  Mucinex Dm Maximum Strength 60-1200  Mg Xr12h-Tab (Dextromethorphan-Guaifenesin) .... Take 1 Tablet By Mouth Once A Day As Needed. 11)  Zantac 150 Mg Tabs (Ranitidine Hcl) .... Take 1 Tablet By Mouth Once A Day As Needed. 12)  Terbinafine Hcl 250 Mg Tabs (Terbinafine Hcl) .[name].. 1 Tab By Mouth Daily X 12 Weeks  Allergies (verified): No Known Drug Allergies  Past History:  Past medical, surgical, family and social histories (including risk factors) reviewed, and no changes noted (except as noted below).  Past Medical History: Reviewed history from 07/05/2008 and no changes required. GERD Hypertension  Past Surgical History: Reviewed history from 07/05/2008 and no changes required. facial/jaw cyst surgery x 4 1990 Cholecystectomy  Family History: Reviewed history from 07/05/2008 and no changes required. father: CVA, CAD s/p stents no early MI, high chol mother:ovarian cancer, HTN sister: DM, HTN PGF: DM PGM: CAD MGM: CAD MGF: CAD  Social History: Reviewed history from 03/28/2009 and no changes required. 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 CV:  Denies chest pain or discomfort and swelling of feet. Resp:  Denies shortness of breath. GI:  Denies abdominal pain, bloody stools, constipation, and diarrhea. GU:  Denies dysuria. Neuro:  Denies falling down, headaches, and weakness.  Physical Exam  General:  overweight female in NAd Eyes:  No corneal or conjunctival inflammation noted. EOMI. Perrla. Vision grossly normal. Ears:  External ear exam shows no significant lesions or deformities.  Otoscopic examination reveals clear canals, tympanic membranes are intact bilaterally without bulging, retraction, inflammation or discharge. Hearing is grossly normal bilaterally. Nose:  External nasal examination  shows no deformity or inflammation. Nasal mucosa are pink and moist without lesions or exudates. Mouth:  small oropharynx, MMM, pharynx pink and moist.   Neck:  no carotid bruit or thyromegaly no cervical or supraclavicular lymphadenopathy  Lungs:  Normal respiratory effort, chest expands symmetrically. Lungs are clear to auscultation, no crackles or wheezes. Heart:  Normal rate and regular rhythm. S1 and S2 normal without gallop, murmur, click, rub or other extra sounds. Abdomen:  Bowel sounds positive,abdomen soft and non-tender without masses, organomegaly umbilical hernia reducible Msk:  No deformity or scoliosis noted of thoracic or lumbar spine.   Pulses:  R and L posterior tibial pulses are full and equal bilaterally  Extremities:  no edema Neurologic:  No cranial nerve deficits noted. Station and gait are normal.Sensory, motor and coordinative functions appear intact. Skin:  healing covered  left lower back lesion..sees DErm Psych:  Cognition and judgment appear intact. Alert and cooperative with normal attention span and concentration. No apparent delusions, illusions, hallucinations   Impression & Recommendations:  Problem # 1:  PREOPERATIVE EXAMINATION (ICD-V72.84) Low risk for minor surgery. She should tolerate surgery well.  Given low risk surgery, not sure if to be intubated..will let anesthesia do labs and EKG if needed.   She will scheulde CPX in upcoming months to review prevention and for GYN exam if needed.   Complete Medication List: 1)  Hydrochlorothiazide 25 Mg Tabs (Hydrochlorothiazide) .... Take 1 tablet by mouth once a day 2)  Lisinopril 10 Mg Tabs (Lisinopril) .... Take 1 tablet by mouth once a day 3)  Celebrex 200 Mg Caps (Celecoxib) .[name].. 1 tab by mouth daily 4)  Aspirin 81 Mg Tabs (Aspirin) .... Take 1 tablet by mouth once a day 5)  Multivitamins Tabs (Multiple vitamin) .... Take 1 tablet by mouth once a day 6)  Calcium Carbonate-vitamin D 600-400 Mg-unit Tabs  (Calcium carbonate-vitamin d) .... Take 1 tablet by mouth once a day 7)  Vitamin D 1000 Unit Tabs (Cholecalciferol) .... 2000 i.u. once daily 8)  Vitamin B-12 500 Mcg Tabs (Cyanocobalamin) .[name].. 1000 mg. once daily 9)  Cvs Stool Softener/laxative 8.6-50 Mg Tabs (Sennosides-docusate sodium) .... Take 2  tablets by mouth once a day 10)  Mucinex Dm Maximum Strength 60-1200 Mg Xr12h-tab (Dextromethorphan-guaifenesin) .... Take 1 tablet by mouth once a day as needed. 11)  Zantac 150 Mg Tabs (Ranitidine hcl) .... Take 1 tablet by mouth once a day as needed. 12)  Terbinafine Hcl 250 Mg Tabs (Terbinafine hcl) .[name].. 1 tab by mouth daily x 12 weeks  Patient Instructions: 1)  SChedule CPX in 10/2009.  2)  Call if abdominal pain over umbilical hernia.  Current Allergies (reviewed today): No known allergies   Appended Document: Orders Update    Clinical Lists Changes  Orders: Added new Service order of Est. Patient Level III ([address]) - Signed

## 2010-09-20 NOTE — Letter (Signed)
Summary: Results Follow up Letter  [facility]  [address] [place], [place] [address]   Phone: [phone]  Fax: [phone]    11/24/2009 MRN: [id number]     [facility] [address] [place], [place]  [address]    Dear Ms. Hataway,  The following are the results of your recent test(s):  Test         Result    Pap Smear:        Normal ___x__  Not Normal _____ Comments:Repeat in 1 year ______________________________________________________ Cholesterol: LDL(Bad cholesterol):         Your goal is less than:         HDL (Good cholesterol):       Your goal is more than: Comments:  ______________________________________________________ Mammogram:        Normal _____  Not Normal _____ Comments:  ___________________________________________________________________ Hemoccult:        Normal _____  Not normal _______ Comments:    _____________________________________________________________________ Other Tests:    We routinely do not discuss normal results over the telephone.  If you desire a copy of the results, or you have any questions about this information we can discuss them at your next office visit.   Sincerely,  Kerby Nora MD

## 2010-09-20 NOTE — Letter (Signed)
Summary: [facility]   Imported By: Lanelle Bal 11/29/2009 12:17:51  _____________________________________________________________________  External Attachment:    Type:   Image     Comment:   External Document

## 2010-09-20 NOTE — Progress Notes (Signed)
Summary: refill request for terbinafine  Phone Note Refill Request Message from:  Fax from Pharmacy  Refills Requested: Medication #1:  TERBINAFINE HCL 250 MG TABS 1 tab by mouth daily x 12 weeks   Last Refilled: 11/17/2009 Faxed request from [address], [place].  Initial call taken by: Lowella Petties CMA,  February 16, 2010 1:26 PM  Follow-up for Phone Call        LFTS nml in 12/2009. Follow-up by: Kerby Nora MD,  February 16, 2010 2:06 PM    Prescriptions: TERBINAFINE HCL 250 MG TABS (TERBINAFINE HCL) 1 tab by mouth daily x 12 weeks  #90 Each x 0   Entered and Authorized by:   Kerby Nora MD   Signed by:   Kerby Nora MD on 02/16/2010   Method used:   Telephoned to ...       [facility]  [address] (retail)       [address]       [place], [place]  [address]       Ph: [phone]       Fax: [phone]   RxID:   [id number]   Appended Document: refill request for terbinafine sent electronically not telephoned in.[name] CMA

## 2010-09-20 NOTE — Assessment & Plan Note (Signed)
Summary: back and leg pain/dlo   Vital Signs:  Patient profile:   65 year old female Height:      63 inches Weight:      193.0 pounds BMI:     34.31 Temp:     98.7 degrees F oral Pulse rate:   76 / minute Pulse rhythm:   regular BP sitting:   120 / 80  (left arm) Cuff size:   regular  Vitals Entered By: Benny Lennert CMA Duncan Dull) (May 14, 2010 12:09 PM)  History of Present Illness: Chief complaint lower back and right leg pain  65 year old female:  Low back pain, right sided leg pain, radiating down to her buttocks, certain ways that she sits. Like a cramp  Sometimes worese, than others, sometimes will be OK. 15 years.    Back Pain History:      The patient's back pain has been present for > 6 weeks.  The pain is located in the lower back region and does radiate below the knees.  She states this is not work related.  She states that she has had a prior history of back pain.  The patient has not had any recent physical therapy for her back pain.    Critical Exclusionary Diagnosis Criteria (CEDC) for Back Pain:      The patient denies a history of previous trauma.  She has no prior history of spinal surgery.  There are no symptoms to suggest infection or cauda equina.  Cancer risk factors include no improvement in low back pain after 4-6 weeks therapy.  Possible psychosocial cause(s) for her back pain include failure of previous therapy.     Allergies (verified): No Known Drug Allergies  Past History:  Past medical, surgical, family and social histories (including risk factors) reviewed, and no changes noted (except as noted below).  Past Medical History: Reviewed history from 02/26/2010 and no changes required. GERD Hypertension Gout  Past Surgical History: Reviewed history from 07/05/2008 and no changes required. facial/jaw cyst surgery x 4 1990 Cholecystectomy  Family History: Reviewed history from 07/05/2008 and no changes required. father: CVA, CAD s/p  stents no early MI, high chol mother:ovarian cancer, HTN sister: DM, HTN PGF: DM PGM: CAD MGM: CAD MGF: CAD  Social History: Reviewed history from 03/28/2009 and no changes required. 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    Physical Exam  General:  GEN: Well-developed,well-nourished,in no acute distress; alert,appropriate and cooperative throughout examination HEENT: Normocephalic and atraumatic without obvious abnormalities. No apparent alopecia or balding. Ears, externally no deformities PULM: Breathing comfortably in no respiratory distress EXT: No clubbing, cyanosis, or edema PSYCH: Normally interactive. Cooperative during the interview. Pleasant. Friendly and conversant. Not anxious or depressed appearing. Normal, full affect.  Msk:  Normal Greater trochanteric bursae Full hip ROM Negative Faber Negative Reverse Faber Sciatic Notches: ttp her spine is diffusely spasm Sensation to Gross touch WNL Sensation to pinpricnk WNL no clonus DP and PT pulses are normal B   Low Back Pain Physical Exam:    Palpation-spinal tenderness:   Yes  Location:   L5-S1    Motor Exam/Strength:         Left Ankle Dorsiflexion (L5,L4):     normal       Left Great Toe Dorsiflexion (L5,L4):     normal       Left Heel Walk (L5,some L4):     normal       Left Single Squat & Rise-Quads (L4):   normal       Left Toe Walk-calf (S1):       normal       Right Ankle Dorsiflexion (L5,L4):     normal       Right Great Toe Dorsiflexion (L5,L4):       normal       Right  Heel Walk (L5,some L4):     normal       Right Single Squat & Rise Quads (L4):   normal       Right Toe Walk-calf (S1):       normal    Sensory Exam/Pinprick:        Left Medial Foot (L4):   normal       Left Dorsal Foot (L5):   normal       Left Lateral Foot (S1):   normal       Right Medial Foot (L4):   normal       Right Dorsal Foot (L5):   normal       Right Lateral Foot (S1):   normal    Reflexes:        Left Knee Jerk (L4):     normal       Left Ankle Reflex (S1):   normal       Right Knee Jerk:     normal       Right Ankle Reflex (S1):   normal    Straight Leg Raise (SLR):       Left Straight Leg Raise (SLR):   negative       Right Straight Leg Raise (SLR):   negative   Impression & Recommendations:  Problem # 1:  SCIATICA, RIGHT (ICD-724.3) Assessment New lumbar radiculopathy, certainly with a component of piriformis syndrome, and cannot rule out this pathology versus bony impingement in and around the nerve root.  Start ROM protocols, direct massage of piriformis, HIP ROM, core stabily, pelvic stability Muscle Relaxant  neuromodulators  The following medications were removed from the medication list:    Aspirin 81 Mg Tabs (Aspirin) .[name]... Take 1 tablet by mouth once a day    Tizanidine Hcl 4 Mg Tabs (Tizanidine hcl) .[name]... 1 by mouth at bedtime as needed back    Tramadol Hcl 50 Mg Tabs (Tramadol hcl) .[name]... 1 by mouth 4 times daily as needed pain Her updated medication list for this problem includes:    Celebrex 200 Mg Caps (Celecoxib) .[name]... 1 tab by mouth daily    Cyclobenzaprine Hcl 10 Mg Tabs (Cyclobenzaprine hcl) .[name]... 1/2 to 1 tab by mouth at bedtime as needed muscle spasm  Orders: Physical Therapy Referral (PT)  Problem # 2:  BACK PAIN, LUMBAR, WITH RADICULOPATHY (ICD-724.4) Assessment: Deteriorated  The following medications were removed from the medication list:    Aspirin 81 Mg Tabs (Aspirin) .[name]... Take 1 tablet by mouth once a day    Tizanidine Hcl 4 Mg  Tabs (Tizanidine hcl) .[name]... 1 by mouth at bedtime as needed back    Tramadol Hcl 50 Mg Tabs (Tramadol hcl) .[name]... 1 by mouth  4 times daily as needed pain Her updated medication list for this problem includes:    Celebrex 200 Mg Caps (Celecoxib) .[name]... 1 tab by mouth daily    Cyclobenzaprine Hcl 10 Mg Tabs (Cyclobenzaprine hcl) .[name]... 1/2 to 1 tab by mouth at bedtime as needed muscle spasm  Complete Medication List: 1)  Hydrochlorothiazide 25 Mg Tabs (Hydrochlorothiazide) .... Take 1 tablet by mouth once a day 2)  Lisinopril 10 Mg Tabs (Lisinopril) .... Take 1 tablet by mouth once a day 3)  Celebrex 200 Mg Caps (Celecoxib) .[name].. 1 tab by mouth daily 4)  Multivitamins Tabs (Multiple vitamin) .... Take 1 tablet by mouth once a day 5)  Vitamin D 1000 Unit Tabs (Cholecalciferol) .... 2000 i.u. once daily 6)  Vitamin B-12 500 Mcg Tabs (Cyanocobalamin) .[name].. 1000 mg. once daily 7)  Cvs Stool Softener/laxative 8.6-50 Mg Tabs (Sennosides-docusate sodium) .... Take 2  tablets by mouth once a day 8)  Mucinex Dm Maximum Strength 60-1200 Mg Xr12h-tab (Dextromethorphan-guaifenesin) .... Take 1 tablet by mouth once a day as needed. 9)  Terbinafine Hcl 250 Mg Tabs (Terbinafine hcl) .[name].. 1 tab by mouth daily x 12 weeks 10)  Allopurinol 300 Mg Tabs (Allopurinol) .... Take 1 tablet by mouth once a day 11)  Fish Oil 1000 Mg Caps (Omega-3 fatty acids) .... 4000mg  divided daily 12)  Cyclobenzaprine Hcl 10 Mg Tabs (Cyclobenzaprine hcl) .... 1/2 to 1 tab by mouth at bedtime as needed muscle spasm 13)  Gabapentin 300 Mg Caps (Gabapentin) .[name].. 1 by mouth three times a day  Patient Instructions: 1)  Referral Appointment Information 2)  Day/Date: 3)  Time: 4)  Place/MD: 5)  Address: 6)  Phone/Fax: 7)  Patient given appointment information. Information/Orders faxed/mailed.  Prescriptions: GABAPENTIN 300 MG CAPS (GABAPENTIN) 1 by mouth three times a day  #90 x 3   Entered and Authorized by:   Hannah Beat MD    Signed by:   Hannah Beat MD on 05/14/2010   Method used:   Electronically to        [facility] (retail)       [address]       [place], [place]  [address]       Ph: [phone]       Fax: [phone]   RxID:   [id number] CYCLOBENZAPRINE HCL 10 MG TABS (CYCLOBENZAPRINE HCL) 1/2 to 1 tab by mouth at bedtime as needed muscle spasm  #30 x 0   Entered and Authorized by:   Hannah Beat MD   Signed by:   Hannah Beat MD on 05/14/2010   Method used:   Electronically to        [facility] (retail)       [address]       [place], [place]  [address]       Ph: [phone]       Fax: [phone]   RxID:   [id number]   Current Allergies (reviewed today): No known allergies

## 2010-09-20 NOTE — Assessment & Plan Note (Signed)
Summary: cpx   Vital Signs:  Patient profile:   65 year old female Height:      63 inches Weight:      186.38 pounds BMI:     33.14 Temp:     98.1 degrees F oral Pulse rate:   72 / minute Pulse rhythm:   regular BP sitting:   130 / 84  (left arm) Cuff size:   regular  Vitals Entered By: Linde Gillis CMA Duncan Dull) (November 17, 2009 11:13 AM) CC: 30 minute exam, Hypertension Management   History of Present Illness: Recent foot surgery on left foot.  Also recently diagnosed  with gout in right footIindomethacine  25 mg three times a day for gout pain BID...told to continue indefinately.. Improved significantly.No current flare at all. Urine Dr. Ether [facility] Has no further follow ups with him.   Hold indomethacine given on celebrex.     Hypertension History:      She complains of peripheral edema, but denies headache, chest pain, palpitations, dyspnea with exertion, and side effects from treatment.  Not checking at home.  Swelling some from recent surgery. [name]        Positive major cardiovascular risk factors include female age 43 years old or older and hypertension.  Negative major cardiovascular risk factors include non-tobacco-user status.     Problems Prior to Update: 1)  Preventive Health Care  (ICD-V70.0) 2)  Gout  (ICD-274.9) 3)  Special Screening For Osteoporosis  (ICD-V82.81) 4)  Other Screening Mammogram  (ICD-V76.12) 5)  Preoperative Examination  (ICD-V72.84) 6)  Mourning  (ICD-309.0) 7)  Skin Lesion  (ICD-709.9) 8)  Other Specified Disorder of Skin  (ICD-709.8) 9)  Unspecified Hypothyroidism  (ICD-244.9) 10)  Screening For Lipoid Disorders  (ICD-V77.91) 11)  Onychomycosis, Bilateral  (ICD-110.1) 12)  Hypertension  (ICD-401.9) 13)  Gerd  (ICD-530.81) 14)  ? of Rheumatoid Arthritis  (ICD-714.0)  Current Medications (verified): 1)  Hydrochlorothiazide 25 Mg Tabs (Hydrochlorothiazide) .... Take 1 Tablet By Mouth Once A Day 2)  Lisinopril 10 Mg Tabs  (Lisinopril) .... Take 1 Tablet By Mouth Once A Day 3)  Celebrex 200 Mg Caps (Celecoxib) .[name].. 1 Tab By Mouth Daily 4)  Aspirin 81 Mg  Tabs (Aspirin) .... Take 1 Tablet By Mouth Once A Day 5)  Multivitamins   Tabs (Multiple Vitamin) .... Take 1 Tablet By Mouth Once A Day 6)  Calcium Carbonate-Vitamin D 600-400 Mg-Unit  Tabs (Calcium Carbonate-Vitamin D) .... Take 1 Tablet By Mouth Once A Day 7)  Vitamin D 1000 Unit  Tabs (Cholecalciferol) .... 2000 I.u. Once Daily 8)  Vitamin B-12 500 Mcg  Tabs (Cyanocobalamin) .[name].. 1000 Mg. Once Daily 9)  Cvs Stool Softener/laxative 8.6-50 Mg Tabs (Sennosides-Docusate Sodium) .... Take 2  Tablets By Mouth Once A Day 10)  Mucinex Dm Maximum Strength 60-1200 Mg Xr12h-Tab (Dextromethorphan-Guaifenesin) .... Take 1 Tablet By Mouth Once A Day As Needed. 11)  Terbinafine Hcl 250 Mg Tabs (Terbinafine Hcl) .[name].. 1 Tab By Mouth Daily X 12 Weeks 12)  Omega Red 300mg  .... Take One Tablet By Mouth Daily 13)  Meloxicam 7.5 Mg Tabs (Meloxicam) .... Take One Tablet By Mouth Daily 14)  Indomethacin 25 Mg Caps (Indomethacin) .... Take One Tablet By Mouth Two Times A Day  Allergies (verified): No Known Drug Allergies  Past History:  Past medical, surgical, family and social histories (including risk factors) reviewed, and no changes noted (except as noted below).  Past Medical History: Reviewed history from 07/05/2008 and no  changes required. GERD Hypertension  Past Surgical History: Reviewed history from 07/05/2008 and no changes required. facial/jaw cyst surgery x 4 1990 Cholecystectomy  Family History: Reviewed history from 07/05/2008 and no changes required. father: CVA, CAD s/p stents no early MI, high chol mother:ovarian cancer, HTN sister: DM, HTN PGF: DM PGM: CAD MGM: CAD MGF: CAD  Social History: Reviewed history from 03/28/2009 and no changes required. Occupation: Hair stylist married--husband die end of 02/2009 unexpectedly no children, 2 step  children Never Smoked Alcohol use-yes Drug use-no Regular exercise-walking Diet: fruit and veggies  Review of Systems General:  Denies fatigue and fever. CV:  Denies chest pain or discomfort. Resp:  Denies shortness of breath. GI:  Denies abdominal pain. GU:  Denies dysuria.  Physical Exam  General:  overweight femal einNAd Head:  Normocephalic and atraumatic without obvious abnormalities. No apparent alopecia or balding. Ears:  External ear exam shows no significant lesions or deformities.  Otoscopic examination reveals clear canals, tympanic membranes are intact bilaterally without bulging, retraction, inflammation or discharge. Hearing is grossly normal bilaterally. Nose:  External nasal examination shows no deformity or inflammation. Nasal mucosa are pink and moist without lesions or exudates. Mouth:  Oral mucosa and oropharynx without lesions or exudates.  Teeth in good repair. Neck:  no carotid bruit or thyromegaly no cervical or supraclavicular lymphadenopathy  Chest Wall:  No deformities, masses, or tenderness noted. Breasts:  No mass, nodules, thickening, tenderness, bulging, retraction, inflamation, nipple discharge or skin changes noted.   Lungs:  Normal respiratory effort, chest expands symmetrically. Lungs are clear to auscultation, no crackles or wheezes. Heart:  Normal rate and regular rhythm. S1 and S2 normal without gallop, murmur, click, rub or other extra sounds. Abdomen:  Bowel sounds positive,abdomen soft and non-tender without masses, organomegaly or hernias noted. Pulses:  R and L posterior tibial pulses are full and equal bilaterally  Extremities:  no edema  Neurologic:  No cranial nerve deficits noted. Station and gait are normal. DTRs are symmetrical throughout. Sensory, motor and coordinative functions appear intact. Skin:  Intact without suspicious lesions or rashes Psych:  Cognition and judgment appear intact. Alert and cooperative with normal attention  span and concentration. No apparent delusions, illusions, hallucinations   Impression & Recommendations:  Problem # 1:  Preventive Health Care (ICD-V70.0)  Welcome to medicare physcial. The patient's preventative maintenance and recommended screening tests for an annual wellness exam were reviewed in full today. Brought up to date unless services declined.  Counselled on the importance of diet, exercise, and its role in overall health and mortality. The patient's FH and SH was reviewed, including their home life, tobacco status, and drug and alcohol status.      Orders: Pelvic & Breast Exam ( Medicare)  (Z6109)  Problem # 2:  UNSPECIFIED HYPOTHYROIDISM (ICD-244.9) Due for reval in May 2011.   Problem # 3:  HYPERTENSION (ICD-401.9) Well controlled. Continue current medication.  Her updated medication list for this problem includes:    Hydrochlorothiazide 25 Mg Tabs (Hydrochlorothiazide) .[name]... Take 1 tablet by mouth once a day    Lisinopril 10 Mg Tabs (Lisinopril) .[name]... Take 1 tablet by mouth once a day  Problem # 4:  GOUT (ICD-274.9) Counsled on dietary changes and prventative meds. Will check uric acid in 1 month. Obtain old records for review. On celebrex AND indomethacin...concerning for GI bleed risk and ulcer risk. Hold indomethacin.  Her updated medication list for this problem includes:    Celebrex 200 Mg Caps (Celecoxib) .Marland KitchenMarland KitchenMarland KitchenMarland Kitchen  1 tab by mouth daily    Meloxicam 7.5 Mg Tabs (Meloxicam) .[name]... Take one tablet by mouth daily    Indomethacin 25 Mg Caps (Indomethacin) .[name]... Take one tablet by mouth two times a day  Complete Medication List: 1)  Hydrochlorothiazide 25 Mg Tabs (Hydrochlorothiazide) .... Take 1 tablet by mouth once a day 2)  Lisinopril 10 Mg Tabs (Lisinopril) .... Take 1 tablet by mouth once a day 3)  Celebrex 200 Mg Caps (Celecoxib) .[name].. 1 tab by mouth daily 4)  Aspirin 81 Mg Tabs (Aspirin) .... Take 1 tablet by mouth once a day 5)  Multivitamins Tabs (Multiple  vitamin) .... Take 1 tablet by mouth once a day 6)  Calcium Carbonate-vitamin D 600-400 Mg-unit Tabs (Calcium carbonate-vitamin d) .... Take 1 tablet by mouth once a day 7)  Vitamin D 1000 Unit Tabs (Cholecalciferol) .... 2000 i.u. once daily 8)  Vitamin B-12 500 Mcg Tabs (Cyanocobalamin) .[name].. 1000 mg. once daily 9)  Cvs Stool Softener/laxative 8.6-50 Mg Tabs (Sennosides-docusate sodium) .... Take 2  tablets by mouth once a day 10)  Mucinex Dm Maximum Strength 60-1200 Mg Xr12h-tab (Dextromethorphan-guaifenesin) .... Take 1 tablet by mouth once a day as needed. 11)  Terbinafine Hcl 250 Mg Tabs (Terbinafine hcl) .[name].. 1 tab by mouth daily x 12 weeks 12)  Omega Red 300mg   .... Take one tablet by mouth daily 13)  Meloxicam 7.5 Mg Tabs (Meloxicam) .... Take one tablet by mouth daily 14)  Indomethacin 25 Mg Caps (Indomethacin) .... Take one tablet by mouth two times a day  Other Orders: TD Toxoids IM 7 YR + (93235) Pneumococcal Vaccine (57322) Admin 1st Vaccine (02542) Admin of Any Addtl Vaccine (70623) Radiology Referral (Radiology)  Hypertension Assessment/Plan:      The patient's hypertensive risk group is category B: At least one risk factor (excluding diabetes) with no target organ damage.  Her calculated 10 year risk of coronary heart disease is 15 %.  Today's blood pressure is 130/84.  Her blood pressure goal is < 140/90.  Patient Instructions: 1)  Schedule fasting LIPIDS,  CMET, TSH  Dx 272.0, 244.9, uric acid Dx  274.9 in May. 2)  Cut back on shellfish, red meat, alcohol. 3)   Stop  indomethacine.  4)  Referral Appointment Information 5)  Day/Date: 6)  Time: 7)  Place/MD: 8)  Address: 9)  Phone/Fax: 10)  Patient given appointment information. Information/Orders faxed/mailed.  Prescriptions: CELEBREX 200 MG CAPS (CELECOXIB) 1 tab by mouth daily  #90 x 3   Entered and Authorized by:   Kerby Nora MD   Signed by:   Kerby Nora MD on 11/17/2009   Method used:   Electronically to         [facility] (mail-order)             ,          Ph: [phone]       Fax: [phone]   RxID:   [id number] HYDROCHLOROTHIAZIDE 25 MG TABS (HYDROCHLOROTHIAZIDE) Take 1 tablet by mouth once a day  #90 x 3   Entered and Authorized by:   Kerby Nora MD   Signed by:   Kerby Nora MD on 11/17/2009   Method used:   Electronically to        [facility] (mail-order)             ,          Ph: [phone]       Fax:  [phone]   RxID:   [id number] LISINOPRIL 10 MG TABS (LISINOPRIL) Take 1 tablet by mouth once a day  #90 x 3   Entered and Authorized by:   Kerby Nora MD   Signed by:   Kerby Nora MD on 11/17/2009   Method used:   Electronically to        [facility] (mail-order)             ,          Ph: [phone]       Fax: [phone]   RxID:   [id number] TERBINAFINE HCL 250 MG TABS (TERBINAFINE HCL) 1 tab by mouth daily x 12 weeks  #90 Each x 0   Entered and Authorized by:   Kerby Nora MD   Signed by:   Kerby Nora MD on 11/17/2009   Method used:   Electronically to        [facility]  [address] (retail)       [address]       [place], [place]  [address]       Ph: [phone]       Fax: [phone]   RxID:   [id number] INDOMETHACIN 25 MG CAPS (INDOMETHACIN) take one tablet by mouth two times a day  #60 x 0   Entered and Authorized by:   Kerby Nora MD   Signed by:   Kerby Nora MD on 11/17/2009   Method used:   Electronically to        [facility]  [address] (retail)       [address]       [place], [place]  [address]       Ph: [phone]       Fax: [phone]   RxID:   [id number]   Laboratory Results      Current Allergies (reviewed today): No known allergies  Flex Sig Next Due:  Not Indicated Hemoccult Next Due:  Not Indicated Last PAP:  Normal (05/26/2008 10:43:50 AM) PAP Next Due:  2 yr        Tetanus/Td Vaccine    Vaccine Type: Td    Site: right  deltoid    Mfr: Sanofi Pasteur    Dose: 0.5 ml    Route: IM    Given by: Linde Gillis CMA (AAMA)    Exp. Date: 08/03/2011    Lot #: [id number]    VIS given: 07/07/07 version given November 17, 2009.  Pneumovax    Vaccine Type: Pneumovax    Site: left deltoid    Mfr: Merck    Dose: 0.5 ml    Route: IM    Given by: Linde Gillis CMA (AAMA)    Exp. Date: 04/02/2011    Lot #: [id number]    VIS given: 03/16/96 version given November 17, 2009.     Immunizations Administered:  Tetanus Vaccine:    Vaccine Type: Td    Site: right deltoid    Mfr: Sanofi Pasteur    Dose: 0.5 ml    Route: IM    Given by: Linde Gillis CMA (AAMA)    Exp. Date: 08/03/2011    Lot #: [id number]    VIS given: 07/07/07 version given November 17, 2009.  Pneumonia Vaccine:    Vaccine Type: Pneumovax    Site: left deltoid    Mfr: Merck    Dose:  0.5 ml    Route: IM    Given by: Linde Gillis CMA (AAMA)    Exp. Date: 04/02/2011    Lot #: [id number]    VIS given: 03/16/96 version given November 17, 2009.

## 2010-09-20 NOTE — Progress Notes (Signed)
Summary: regarding dosing on colcrys  Phone Note From Pharmacy   Caller: [facility]  [address] Summary of Call: I sent colcrys to pharmacy this morning, [facility] stating dosing has changed.  Note is on your desk. Initial call taken by: Lowella Petties CMA,  April 03, 2010 2:06 PM

## 2010-09-20 NOTE — Progress Notes (Signed)
Summary: allupurinol   Phone Note Call from Patient Call back at Home Phone [phone]   Summary of Call: Patient has finished her colchicine and says that the gout flare up is gone. She is asking for allupurinol to be sent to [facility].  Initial call taken by: Melody Comas,  Jan 03, 2010 11:59 AM  Follow-up for Phone Call        Pt called back, she [facility] [place].          Lowella Petties CMA  Jan 03, 2010 12:08 PM   Additional Follow-up for Phone Call Additional follow up Details #1::        start here and would titrate up Additional Follow-up by: Hannah Beat MD,  Jan 03, 2010 2:02 PM    New/Updated Medications: ALLOPURINOL 100 MG TABS (ALLOPURINOL) 1 by mouth daily Prescriptions: ALLOPURINOL 100 MG TABS (ALLOPURINOL) 1 by mouth daily  #90 x 0   Entered and Authorized by:   Hannah Beat MD   Signed by:   Hannah Beat MD on 01/03/2010   Method used:   [facility] to        [facility] [address]       [address], [address]       [place], [place]  [address]       Ph: [phone]       Fax: [phone]   RxID:   [id number]

## 2010-09-20 NOTE — Progress Notes (Signed)
Summary: refill request for tizanidine  Phone Note Refill Request Message from:  Fax from Pharmacy  Refills Requested: Medication #1:  tizanidine 4 mg   Last Refilled: 05/08/2010 Faxed request from [facility], this was removed from med list 05/15/10.  Initial call taken by: Lowella Petties CMA, AAMA,  September 05, 2010 3:13 PM  Follow-up for Phone Call        She was placed on cyclobenzaprine by Dr. Patsy Lager at that time. She cannot use both at same time. Call pt to determine what is going on etc.  Follow-up by: Kerby Nora MD,  September 05, 2010 10:24 PM  Additional Follow-up for Phone Call Additional follow up Details #1::        Left message for patient to return my call Additional Follow-up by: Benny Lennert CMA Duncan Dull),  September 06, 2010 2:48 PM    Additional Follow-up for Phone Call Additional follow up Details #2::    unable to reach patient will let pharmacy know patient needs to contact office before refills if any Follow-up by: Benny Lennert CMA Duncan Dull),  September 10, 2010 9:08 AM

## 2010-09-20 NOTE — Progress Notes (Signed)
Summary: refill request for celexa  Phone Note Refill Request Call back at Home Phone [phone] Message from:  Patient  Refills Requested: Medication #1:  CELEBREX 200 MG CAPS 1 tab by mouth daily Phoned request from pt, please send to [facility] and advise pt.  Initial call taken by: Lowella Petties CMA,  October 05, 2009 11:41 AM    Prescriptions: CELEBREX 200 MG CAPS (CELECOXIB) 1 tab by mouth daily  #90 x 1   Entered and Authorized by:   Hannah Beat MD   Signed by:   Hannah Beat MD on 10/05/2009   Method used:   Electronically to        [facility] (mail-order)             ,          Ph: [phone]       Fax: [phone]   RxID:   [id number]

## 2010-09-20 NOTE — Progress Notes (Signed)
Summary: regarding colcrys  Phone Note Call from Patient Call back at Home Phone [phone]   Caller: Patient Call For: Kerby Nora MD Summary of Call: Pt is asking about status of colcrys script at [facility] in [place].  They are waiting for [place] to change dosage. Initial call taken by: Lowella Petties CMA,  April 06, 2010 8:21 AM  Follow-up for Phone Call        I completed forms on my desk...in box empty.Marland KitchenMarland KitchenI don't recall seeing a form about this...can we locate or get a second one.  Follow-up by: Kerby Nora MD,  April 06, 2010 8:23 AM  Additional Follow-up for Phone Call Additional follow up Details #1::        Spoke with pharmacist , he read recommended dosing to me, note is on your desk.          Lowella Petties CMA  April 06, 2010 9:50 AM      Appended Document: regarding colcrys Advised pharmacist, directions changed in EMR.

## 2010-09-20 NOTE — Letter (Signed)
Summary: [facility]   Imported By: Lanelle Bal 11/29/2009 12:20:34  _____________________________________________________________________  External Attachment:    Type:   Image     Comment:   External Document

## 2010-09-20 NOTE — Progress Notes (Signed)
Summary: regarding antiinflammatories  Phone Note From Pharmacy   Caller: [facility] Summary of Call: [facility] pharmacist is questioning whether pt is taking celebrex, mobic and indomethacin all at the same time.  He said pt is getting these filled regularly.  Please advise.       phone [phone], reference [phone]. Initial call taken by: Lowella Petties CMA,  December 12, 2009 3:53 PM  Follow-up for Phone Call        Pt told at last office visit to hold indomethacin given by Dr. Gavin Potters as she is on celebrex.  It appear prescription was sent in erroneously in 11/2009 for indomethacin. I agree she also nbeeds to hold mobic.Marland Kitchenthis has not been filled in a while. Please call pt..she is only to take celebrex.Marland Kitchenother meds removed from med list.  Follow-up by: Kerby Nora MD,  December 12, 2009 5:10 PM  Additional Follow-up for Phone Call Additional follow up Details #1::        Advised pt, she understands to take only celebrex.  I cancelled mobic and indomethicin scripts at [facility] Additional Follow-up by: Lowella Petties CMA,  December 13, 2009 8:34 AM

## 2010-09-20 NOTE — Consult Note (Signed)
Summary: [facility]   Imported By: Lanelle Bal 11/29/2009 12:21:37  _____________________________________________________________________  External Attachment:    Type:   Image     Comment:   External Document

## 2010-09-20 NOTE — Progress Notes (Signed)
Summary: needs order for mammogram  Phone Note From Other Clinic   Caller: Nettie Elm at [facility]  [phone] Summary of Call: Pt needs order for mammogram faxed to [place].  She had mammogram done this morning but didnt have the order. Initial call taken by: Lowella Petties CMA,  November 28, 2009 11:29 AM  Follow-up for Phone Call        Mammogram has be faxed...per pts request.Cynthia Davis  December 01, 2009 1:53 PM  Follow-up by: Daine Gip,  December 01, 2009 1:53 PM

## 2010-09-20 NOTE — Miscellaneous (Signed)
Summary: FLU VACCINE  Clinical Lists Changes  Observations: Added new observation of FLU VAX: Historical (04/24/2010 14:33)      Influenza Immunization History:    Influenza # 1:  Historical (04/24/2010) flu shot given by [facility] 04/24/2010 DeShannon Smith CMA (AAMA)  April 25, 2010 2:33 PM

## 2010-09-20 NOTE — Letter (Signed)
Summary: Results Follow up Letter  [place] [facility]  [address] [place], [place] [address]   Phone: [phone]  Fax: [phone]    12/06/2009 MRN: [id number]      [facility] [address] [place], [place]  [address]    Dear Theresa Dodson,  The following are the results of your recent test(s):  Test         Result    Pap Smear:        Normal _____  Not Normal _____ Comments: ______________________________________________________ Cholesterol: LDL(Bad cholesterol):         Your goal is less than:         HDL (Good cholesterol):       Your goal is more than: Comments:  ______________________________________________________ Mammogram:        Normal __x___  Not Normal _____ Comments:Repeat in 1 year  ___________________________________________________________________ Hemoccult:        Normal _____  Not normal _______ Comments:    _____________________________________________________________________ Other Tests:    We routinely do not discuss normal results over the telephone.  If you desire a copy of the results, or you have any questions about this information we can discuss them at your next office visit.   Sincerely,  Kerby Nora MD

## 2010-09-20 NOTE — Miscellaneous (Signed)
Summary: med list update- colcrys  Clinical Lists Changes  Medications: Changed medication from COLCRYS 0.6 MG TABS (COLCHICINE) 2 tab by mouth x 1 then 1 tab by mouth every 2 hpours until paingone, have diarrhea or reach max of 10 pills in 24 hours. to COLCRYS 0.6 MG TABS (COLCHICINE) Take 2 tabs at first sign of flare up, then may take one additional tab after one hour, max of 3 tabs in one hour     Prior Medications: HYDROCHLOROTHIAZIDE 25 MG TABS (HYDROCHLOROTHIAZIDE) Take 1 tablet by mouth once a day LISINOPRIL 10 MG TABS (LISINOPRIL) Take 1 tablet by mouth once a day CELEBREX 200 MG CAPS (CELECOXIB) 1 tab by mouth daily ASPIRIN 81 MG  TABS (ASPIRIN) Take 1 tablet by mouth once a day MULTIVITAMINS   TABS (MULTIPLE VITAMIN) Take 1 tablet by mouth once a day CALCIUM CARBONATE-VITAMIN D 600-400 MG-UNIT  TABS (CALCIUM CARBONATE-VITAMIN D) Take 1 tablet by mouth once a day VITAMIN D 1000 UNIT  TABS (CHOLECALCIFEROL) 2000 i.u. once daily VITAMIN B-12 500 MCG  TABS (CYANOCOBALAMIN) 1000 mg. once daily [facility] STOOL SOFTENER/LAXATIVE 8.6-50 MG TABS (SENNOSIDES-DOCUSATE SODIUM) Take 2  tablets by mouth once a day MUCINEX DM MAXIMUM STRENGTH 60-1200 MG XR12H-TAB (DEXTROMETHORPHAN-GUAIFENESIN) Take 1 tablet by mouth once a day as needed. TERBINAFINE HCL 250 MG TABS (TERBINAFINE HCL) 1 tab by mouth daily x 12 weeks OMEGA RED 300MG  () take one tablet by mouth daily ALLOPURINOL 100 MG TABS (ALLOPURINOL) 1 by mouth daily TIZANIDINE HCL 4 MG TABS (TIZANIDINE HCL) 1 by mouth at bedtime as needed back TRAMADOL HCL 50 MG  TABS (TRAMADOL HCL) 1 by mouth 4 times daily as needed pain COLCRYS 0.6 MG TABS (COLCHICINE) 2 tabs by mouth now, then 1 by mouth q 2 hours until pain improved, then two times a day until pain resolution Current Allergies: No known allergies

## 2010-09-21 NOTE — Letter (Signed)
Summary: [facility]   Imported By: Lanelle Bal 11/29/2009 12:18:35  _____________________________________________________________________  External Attachment:    Type:   Image     Comment:   External Document

## 2010-11-01 ENCOUNTER — Telehealth: Payer: Self-pay | Admitting: Family Medicine

## 2010-11-06 NOTE — Progress Notes (Signed)
Summary: refill request for celebrex  Phone Note Refill Request Message from:  Patient  Refills Requested: Medication #1:  CELEBREX 200 MG CAPS 1 tab by mouth daily Pt requests refills be sent to [facility].  Initial call taken by: Lowella Petties CMA, AAMA,  November 01, 2010 3:29 PM    Prescriptions: CELEBREX 200 MG CAPS (CELECOXIB) 1 tab by mouth daily  #90 x 3   Entered and Authorized by:   Kerby Nora MD   Signed by:   Kerby Nora MD on 11/02/2010   Method used:   Electronically to        [facility]  [address] (retail)       [address]       [place], [place]  [address]       Ph: [phone]       Fax: [phone]   RxID:   [id number]   Appended Document: Orders Update    Clinical Lists Changes  Medications: Rx of CELEBREX 200 MG CAPS (CELECOXIB) 1 tab by mouth daily;  #90 x 3;  Signed;  Entered by: Kerby Nora MD;  Authorized by: Kerby Nora MD;  Method used: Electronically to [facility] , ,   , Ph: [phone], Fax: [phone]    Prescriptions: CELEBREX 200 MG CAPS (CELECOXIB) 1 tab by mouth daily  #90 x 3   Entered and Authorized by:   Kerby Nora MD   Signed by:   Kerby Nora MD on 11/02/2010   Method used:   Electronically to        [facility] (retail)             ,          Ph: [phone]       Fax: [phone]   RxID:   [id number]

## 2010-12-05 ENCOUNTER — Ambulatory Visit: Payer: Self-pay | Admitting: Family Medicine

## 2010-12-24 ENCOUNTER — Other Ambulatory Visit: Payer: Self-pay

## 2010-12-28 ENCOUNTER — Encounter: Payer: Self-pay | Admitting: Family Medicine

## 2011-01-04 NOTE — Op Note (Signed)
[place]  Patient:    [name], [name]                       MRN: [id number] Proc. Date: 02/13/01 Adm. Date:  [id number] Attending:  Susa Day                           Operative Report  PREOPERATIVE DIAGNOSIS:  Chronic stenosing tenosynovitis, abductor pollicis longus and extensor pollicis longus tendons at first dorsal compartment.  POSTOPERATIVE DIAGNOSIS:  Chronic stenosing tenosynovitis, abductor pollicis longus and extensor pollicis longus tendons at first dorsal compartment.  OPERATION PERFORMED:  Release of first dorsal compartment and tenosynovectomy of abductor pollicis longus and extensor pollicis brevis tendons.  SURGEON:  Katy Fitch. Sypher, Montez Hageman., M.D.  ASSISTANT:  Jonni Sanger, P.A.  ANESTHESIA:  0.25% Marcaine and 1% lidocaine field block supplemented by IV sedation.  SUPERVISING ANESTHESIOLOGIST:  Dr. Michelle Piper.  INDICATIONS FOR PROCEDURE:  [name] is a 65 year old woman who has been involved in hair care who has had chronic tenosynovitis affecting her left first dorsal compartment.  She has failed nonoperative management.  After informed consent, she is brought to the operating room at this time for release of her first dorsal compartment and tenosynovectomy.  DESCRIPTION OF PROCEDURE:  [name] was brought to the operating room and placed in supine position on the operating table.  Following routine Betadine scrub and paint of the left arm, the arm was exsanguinated with the Esmarch bandage.  An arterial tourniquet on the proximal brachium inflated to 250 mmHg.  The procedure commenced with infiltration of 0.25% Marcaine and 1% lidocaine into the path of the intended incision.  When anesthesia was satisfactory, the procedure commenced with a short transverse incision directly over the palpably enlarged compartment.  Subcutaneous tissues were meticulously dissected taking care to identify and  retract the radial sensory branches.  The compartment was noted to be bulging with a fibrotic wall.  The compartment was split longitudinally revealing severe tenosynovitis affecting the abductor pollicis longus and extensor pollicis brevis tendons.  A tenosynovectomy was performed with scissors and a rongeur.  A small septum separating the extensor pollicis brevis was resected with scissors.  Thereafter, the wound was irrigated and repaired with intradermal 3-0 Prolene suture.  For aftercare, [name] was placed in a thumb spica splint with Xeroflo and sterile gauze directly on the wound.  She was given a prescription for Tylenol #3 with codeine one or two tablets p.o. q.4-6h. p.r.n. pain, 20 tablets without refill.  She will return to our office for follow-up evaluation in seven to 10 days. DD:  02/13/01 TD:  02/13/01 Job: [id number]

## 2011-01-30 ENCOUNTER — Ambulatory Visit: Payer: Self-pay | Admitting: Family Medicine

## 2011-02-25 ENCOUNTER — Ambulatory Visit: Payer: Self-pay | Admitting: Family Medicine

## 2011-02-28 ENCOUNTER — Ambulatory Visit: Payer: Self-pay | Admitting: Family Medicine

## 2011-06-26 ENCOUNTER — Ambulatory Visit: Payer: Self-pay | Admitting: Family Medicine

## 2011-07-15 ENCOUNTER — Ambulatory Visit: Payer: Self-pay | Admitting: Surgery

## 2011-07-15 DIAGNOSIS — Z01810 Encounter for preprocedural cardiovascular examination: Secondary | ICD-10-CM

## 2011-07-20 HISTORY — PX: BREAST BIOPSY: SHX20

## 2011-08-06 ENCOUNTER — Ambulatory Visit: Payer: Self-pay | Admitting: Surgery

## 2011-12-12 ENCOUNTER — Ambulatory Visit: Payer: Self-pay | Admitting: Gastroenterology

## 2011-12-16 ENCOUNTER — Ambulatory Visit: Payer: Self-pay | Admitting: Gastroenterology

## 2011-12-18 LAB — PATHOLOGY REPORT

## 2012-02-02 ENCOUNTER — Ambulatory Visit: Payer: Self-pay | Admitting: Medical

## 2012-02-02 LAB — COMPREHENSIVE METABOLIC PANEL
Anion Gap: 7 (ref 7–16)
BUN: 16 mg/dL (ref 7–18)
Calcium, Total: 9.2 mg/dL (ref 8.5–10.1)
Chloride: 102 mmol/L (ref 98–107)
Co2: 28 mmol/L (ref 21–32)
EGFR (Non-African Amer.): >60
SGOT(AST): 49 U/L — ABNORMAL HIGH (ref 15–37)
Sodium: 137 mmol/L (ref 136–145)

## 2012-02-02 LAB — URINALYSIS, COMPLETE
Bacteria: NEGATIVE
Bilirubin,UR: NEGATIVE
Blood: NEGATIVE
Glucose,UR: NEGATIVE mg/dL (ref 0–75)
Ketone: NEGATIVE
Nitrite: NEGATIVE
Ph: 6 (ref 4.5–8.0)

## 2012-02-02 LAB — CBC WITH DIFFERENTIAL/PLATELET
Basophil #: 0 10*3/uL (ref 0.0–0.1)
Eosinophil %: 1 %
HGB: 13.8 g/dL (ref 12.0–16.0)
Lymphocyte #: 1 10*3/uL (ref 1.0–3.6)
MCHC: 33.2 g/dL (ref 32.0–36.0)
Neutrophil %: 77.3 %
RBC: 4.5 10*6/uL (ref 3.80–5.20)
RDW: 15 % — ABNORMAL HIGH (ref 11.5–14.5)

## 2012-02-02 LAB — AMYLASE: Amylase: 79 U/L (ref 25–115)

## 2012-02-02 LAB — OCCULT BLOOD X 1 CARD TO LAB, STOOL: Occult Blood, Feces: NEGATIVE

## 2013-03-10 ENCOUNTER — Ambulatory Visit: Payer: Self-pay | Admitting: Family Medicine

## 2013-05-07 DIAGNOSIS — G4733 Obstructive sleep apnea (adult) (pediatric): Secondary | ICD-10-CM | POA: Insufficient documentation

## 2013-05-07 DIAGNOSIS — Z9989 Dependence on other enabling machines and devices: Secondary | ICD-10-CM

## 2014-06-19 DIAGNOSIS — N183 Chronic kidney disease, stage 3 unspecified: Secondary | ICD-10-CM | POA: Insufficient documentation

## 2014-06-19 DIAGNOSIS — R739 Hyperglycemia, unspecified: Secondary | ICD-10-CM | POA: Insufficient documentation

## 2014-06-28 ENCOUNTER — Ambulatory Visit: Payer: Self-pay | Admitting: Family Medicine

## 2015-07-17 ENCOUNTER — Other Ambulatory Visit: Payer: Self-pay | Admitting: Family Medicine

## 2015-07-17 DIAGNOSIS — Z1231 Encounter for screening mammogram for malignant neoplasm of breast: Secondary | ICD-10-CM

## 2015-07-19 ENCOUNTER — Ambulatory Visit
Admission: RE | Admit: 2015-07-19 | Discharge: 2015-07-19 | Disposition: A | Discharge status: Home / Self Care | Payer: Medicare Other | Source: Ambulatory Visit | Attending: Family Medicine | Admitting: Family Medicine

## 2015-07-19 DIAGNOSIS — Z1231 Encounter for screening mammogram for malignant neoplasm of breast: Secondary | ICD-10-CM | POA: Insufficient documentation

## 2015-07-19 HISTORY — DX: Malignant (primary) neoplasm, unspecified: C80.1

## 2015-08-06 ENCOUNTER — Encounter: Payer: Self-pay | Admitting: Gynecology

## 2015-08-06 ENCOUNTER — Ambulatory Visit
Admission: EM | Admit: 2015-08-06 | Discharge: 2015-08-06 | Disposition: A | Discharge status: Home / Self Care | Payer: Medicare Other | Attending: Family Medicine | Admitting: Family Medicine

## 2015-08-06 DIAGNOSIS — J018 Other acute sinusitis: Secondary | ICD-10-CM

## 2015-08-06 HISTORY — DX: Gout, unspecified: M10.9

## 2015-08-06 HISTORY — DX: Diverticulitis of intestine, part unspecified, without perforation or abscess without bleeding: K57.92

## 2015-08-06 HISTORY — DX: Essential (primary) hypertension: I10

## 2015-08-06 HISTORY — DX: Pure hypercholesterolemia, unspecified: E78.00

## 2015-08-06 MED ORDER — AMOXICILLIN-POT CLAVULANATE 875-125 MG PO TABS: 1.0000 | ORAL_TABLET | Freq: Two times a day (BID) | ORAL | Status: AC

## 2015-08-06 MED ORDER — FLUTICASONE PROPIONATE 50 MCG/ACT NA SUSP: 1.0000 | Freq: Every day | NASAL | Status: DC

## 2015-08-06 NOTE — ED Notes (Signed)
Patient c/o sinusitis. Per patient Jaw pain / sinus drainage and slight cough x 3 days.

## 2015-08-06 NOTE — ED Provider Notes (Signed)
Patient presents today with symptoms of right maxillary sinus tenderness and nasal congestion. Patient states that the symptoms are causing her to have some sore throat and right jaw pain. She does have a mild cough. She denies any chest pain or shortness of breath. She has not experienced any high fever. She has been patent taking Mucinex D.  ROS: Negative except mentioned above. Vitals: as in Epic  GENERAL: NAD HEENT: mild pharyngeal erythema, no exudate, no erythema of TMs, mild right maxillary sinus tenderness, no cervical LAD RESP: CTA B CARD: RRR NEURO: CN II-XII grossly intact  A/P: Sinusitis- Will treat with Augmentin, Flonase, Claritin, Tylenol when necessary, rest, hydration, seek medical attention if symptoms persist or worsen.   Paulina Fusi, MD 08/06/15 [phone]

## 2016-04-30 ENCOUNTER — Other Ambulatory Visit: Payer: Self-pay | Admitting: Family Medicine

## 2016-04-30 DIAGNOSIS — Z1231 Encounter for screening mammogram for malignant neoplasm of breast: Secondary | ICD-10-CM

## 2016-05-08 ENCOUNTER — Other Ambulatory Visit: Payer: Self-pay | Admitting: Family Medicine

## 2016-05-08 DIAGNOSIS — Z780 Asymptomatic menopausal state: Secondary | ICD-10-CM

## 2016-07-22 ENCOUNTER — Other Ambulatory Visit: Payer: Self-pay | Admitting: Family Medicine

## 2016-07-22 ENCOUNTER — Ambulatory Visit
Admission: RE | Admit: 2016-07-22 | Discharge: 2016-07-22 | Disposition: A | Discharge status: Home / Self Care | Payer: Medicare Other | Source: Ambulatory Visit | Attending: Family Medicine | Admitting: Family Medicine

## 2016-07-22 DIAGNOSIS — Z1231 Encounter for screening mammogram for malignant neoplasm of breast: Secondary | ICD-10-CM | POA: Insufficient documentation

## 2016-12-11 ENCOUNTER — Other Ambulatory Visit: Payer: Self-pay | Admitting: Family Medicine

## 2016-12-11 DIAGNOSIS — Z780 Asymptomatic menopausal state: Secondary | ICD-10-CM

## 2017-04-18 ENCOUNTER — Emergency Department
Admission: EM | Admit: 2017-04-18 | Discharge: 2017-04-18 | Disposition: A | Discharge status: Home / Self Care | Payer: Medicare Other | Attending: Emergency Medicine | Admitting: Emergency Medicine

## 2017-04-18 ENCOUNTER — Encounter: Payer: Self-pay | Admitting: Emergency Medicine

## 2017-04-18 ENCOUNTER — Emergency Department: Payer: Medicare Other

## 2017-04-18 DIAGNOSIS — K5732 Diverticulitis of large intestine without perforation or abscess without bleeding: Secondary | ICD-10-CM | POA: Insufficient documentation

## 2017-04-18 DIAGNOSIS — R1031 Right lower quadrant pain: Secondary | ICD-10-CM | POA: Diagnosis present

## 2017-04-18 DIAGNOSIS — I10 Essential (primary) hypertension: Secondary | ICD-10-CM | POA: Diagnosis not present

## 2017-04-18 LAB — COMPREHENSIVE METABOLIC PANEL
ALBUMIN: 3.7 g/dL (ref 3.5–5.0)
ALK PHOS: 111 U/L (ref 38–126)
ALT: 39 U/L (ref 14–54)
ANION GAP: 8 (ref 5–15)
AST: 36 U/L (ref 15–41)
BUN: 15 mg/dL (ref 6–20)
CO2: 25 mmol/L (ref 22–32)
Calcium: 8.9 mg/dL (ref 8.9–10.3)
Chloride: 107 mmol/L (ref 101–111)
Creatinine, Ser: 0.9 mg/dL (ref 0.44–1.00)
GFR calc Af Amer: >60 mL/min (ref >60)
GFR calc non Af Amer: >60 mL/min (ref >60)
GLUCOSE: 125 mg/dL — AB (ref 65–99)
POTASSIUM: 3.8 mmol/L (ref 3.5–5.1)
SODIUM: 140 mmol/L (ref 135–145)
Total Bilirubin: 0.8 mg/dL (ref 0.3–1.2)
Total Protein: 7 g/dL (ref 6.5–8.1)

## 2017-04-18 LAB — URINALYSIS, COMPLETE (UACMP) WITH MICROSCOPIC
BACTERIA UA: NONE SEEN
BILIRUBIN URINE: NEGATIVE
Glucose, UA: NEGATIVE mg/dL
HGB URINE DIPSTICK: NEGATIVE
Ketones, ur: NEGATIVE mg/dL
NITRITE: NEGATIVE
Protein, ur: 30 mg/dL — AB
SPECIFIC GRAVITY, URINE: 1.025 (ref 1.005–1.030)
pH: 5 (ref 5.0–8.0)

## 2017-04-18 LAB — CBC
HEMATOCRIT: 38.8 % (ref 35.0–47.0)
HEMOGLOBIN: 13.6 g/dL (ref 12.0–16.0)
MCH: 31.6 pg (ref 26.0–34.0)
MCHC: 35 g/dL (ref 32.0–36.0)
MCV: 90.3 fL (ref 80.0–100.0)
PLATELETS: 223 10*3/uL (ref 150–440)
RBC: 4.3 MIL/uL (ref 3.80–5.20)
RDW: 14.8 % — AB (ref 11.5–14.5)
WBC: 6.9 10*3/uL (ref 3.6–11.0)

## 2017-04-18 LAB — LIPASE, BLOOD: Lipase: 22 U/L (ref 11–51)

## 2017-04-18 MED ORDER — SODIUM CHLORIDE 0.9 % IV BOLUS (SEPSIS)
1000.0000 mL | Freq: Once | INTRAVENOUS | Status: AC
  Administered 2017-04-18: 1000 mL via INTRAVENOUS

## 2017-04-18 MED ORDER — IOPAMIDOL (ISOVUE-300) INJECTION 61%
100.0000 mL | Freq: Once | INTRAVENOUS | Status: AC | PRN
  Administered 2017-04-18: 100 mL via INTRAVENOUS

## 2017-04-18 MED ORDER — CIPROFLOXACIN HCL 500 MG PO TABS: 500.0000 mg | ORAL_TABLET | Freq: Two times a day (BID) | ORAL | 0 refills | Status: DC

## 2017-04-18 MED ORDER — ONDANSETRON 4 MG PO TBDP: 4.0000 mg | ORAL_TABLET | Freq: Three times a day (TID) | ORAL | 0 refills | Status: DC | PRN

## 2017-04-18 MED ORDER — IOPAMIDOL (ISOVUE-300) INJECTION 61%
30.0000 mL | Freq: Once | INTRAVENOUS | Status: AC | PRN
  Administered 2017-04-18: 30 mL via ORAL

## 2017-04-18 MED ORDER — METRONIDAZOLE 500 MG PO TABS: 500.0000 mg | ORAL_TABLET | Freq: Three times a day (TID) | ORAL | 0 refills | Status: DC

## 2017-04-18 MED ORDER — NAPROXEN 500 MG PO TABS: 500.0000 mg | ORAL_TABLET | Freq: Two times a day (BID) | ORAL | 0 refills | Status: DC

## 2017-04-18 NOTE — ED Notes (Signed)
ED Provider at bedside. 

## 2017-04-18 NOTE — ED Provider Notes (Signed)
[facility] Emergency Department Provider Note  ____________________________________________  Time seen: Approximately 12:16 PM  I have reviewed the triage vital signs and the nursing notes.   HISTORY  Chief Complaint Abdominal Pain    HPI Theresa Dodson is a 71 y.o. female sent to the ED from [place] urgent care for evaluation of possible appendicitis. The patient reports that she has had right lower quadrant abdominal pain that started 2 days ago, constant, gradually worsening, no nausea vomiting or diarrhea or constipation, but she does have loss of appetite. Worse with standing upright and lying on the right side, no alleviating factors. Aching and moderate intensity. Nonradiating.     Past Medical History:  Diagnosis Date  . Cancer ([place])    skin ca  . Diverticulitis   . Gout   . Hypercholesteremia   . Hypertension      Patient Active Problem List   Diagnosis Date Noted  . SCIATICA, RIGHT 05/14/2010  . PURE HYPERCHOLESTEROLEMIA 04/05/2010  . KNEE PAIN, RIGHT 02/26/2010  . BACK PAIN, LUMBAR, WITH RADICULOPATHY 02/09/2010  . GOUT 11/17/2009  . MOURNING 03/28/2009  . SKIN LESION 03/28/2009  . OTHER SPECIFIED DISORDER OF SKIN 02/14/2009  . UNSPECIFIED HYPOTHYROIDISM 12/20/2008  . ONYCHOMYCOSIS, BILATERAL 11/15/2008  . HYPERTENSION 07/05/2008  . GERD 07/05/2008     Past Surgical History:  Procedure Laterality Date  . BREAST BIOPSY Right 07/2011   NEG  . CHOLECYSTECTOMY    . MANDIBLE SURGERY    . WRIST SURGERY       Prior to Admission medications   Medication Sig Start Date End Date Taking? Authorizing Provider  allopurinol (ZYLOPRIM) 100 MG tablet Take 100 mg by mouth daily.   Yes [provider]  allopurinol (ZYLOPRIM) 300 MG tablet Take 300 mg by mouth daily.   Yes [provider]  atorvastatin (LIPITOR) 10 MG tablet Take 10 mg by mouth daily.   Yes [provider]  co-enzyme Q-10 50 MG capsule Take 50  mg by mouth 2 (two) times daily.   Yes [provider]  cyanocobalamin 1000 MCG tablet Take 100 mcg by mouth daily.   Yes [provider]  fluticasone (FLONASE) 50 MCG/ACT nasal spray Place 1 spray into both nostrils daily. For nasal congestion. Patient taking differently: Place 2 sprays into both nostrils daily. For nasal congestion. 08/06/15  Yes Paulina Fusi, MD  gabapentin (NEURONTIN) 600 MG tablet Take 300 mg by mouth 2 (two) times daily.    Yes [provider]  lisinopril (PRINIVIL,ZESTRIL) 10 MG tablet Take 10 mg by mouth daily.   Yes [provider]  omega-3 acid ethyl esters (LOVAZA) 1 G capsule Take 2 g by mouth 2 (two) times daily.    Yes [provider]  acetaminophen (TYLENOL) 650 MG CR tablet Take 650 mg by mouth every 8 (eight) hours as needed for pain.    [provider]  ciprofloxacin (CIPRO) 500 MG tablet Take 1 tablet (500 mg total) by mouth 2 (two) times daily. 04/18/17   Carrie Mew, MD  colchicine 0.6 MG tablet Take 0.6 mg by mouth daily.    [provider]  metroNIDAZOLE (FLAGYL) 500 MG tablet Take 1 tablet (500 mg total) by mouth 3 (three) times daily. 04/18/17   Carrie Mew, MD  naproxen (NAPROSYN) 500 MG tablet Take 1 tablet (500 mg total) by mouth 2 (two) times daily with a meal. 04/18/17   Carrie Mew, MD  ondansetron (ZOFRAN ODT) 4 MG disintegrating tablet  Take 1 tablet (4 mg total) by mouth every 8 (eight) hours as needed for nausea or vomiting. 04/18/17   Carrie Mew, MD     Allergies Patient has no known allergies.   Family History  Problem Relation Age of Onset  . Breast cancer Neg Hx     Social History Social History  Substance Use Topics  . Smoking status: Never Smoker  . Smokeless tobacco: Not on file  . Alcohol use No    Review of Systems  Constitutional:   No fever or chills.  ENT:   No sore throat. No rhinorrhea. Cardiovascular:   No chest pain or  syncope. Respiratory:   No dyspnea or cough. Gastrointestinal:  Positive as above for abdominal pain without vomiting and diarrhea.  Musculoskeletal:   Negative for focal pain or swelling All other systems reviewed and are negative except as documented above in ROS and HPI.  ____________________________________________   PHYSICAL EXAM:  VITAL SIGNS: ED Triage Vitals  Enc Vitals Group     BP 04/18/17 1011 (!) 150/62     Pulse Rate 04/18/17 1011 84     Resp 04/18/17 1011 15     Temp 04/18/17 1011 98.8 F (37.1 C)     Temp Source 04/18/17 1011 Oral     SpO2 04/18/17 1011 100 %     Weight 04/18/17 1012 216 lb (98 kg)     Height 04/18/17 1012 5\' 3"  (1.6 m)     Head Circumference --      Peak Flow --      Pain Score 04/18/17 1018 5     Pain Loc --      Pain Edu? --      Excl. in [place]? --     Vital signs reviewed, nursing assessments reviewed.   Constitutional:   Alert and oriented. Well appearing and in no distress. Eyes:   No scleral icterus.  EOMI. No nystagmus. No conjunctival pallor. PERRL. ENT   Head:   Normocephalic and atraumatic.   Nose:   No congestion/rhinnorhea.    Mouth/Throat:   MMM, no pharyngeal erythema. No peritonsillar mass.    Neck:   No meningismus. Full ROM Hematological/Lymphatic/Immunilogical:   No cervical lymphadenopathy. Cardiovascular:   RRR. Symmetric bilateral radial and DP pulses.  No murmurs.  Respiratory:   Normal respiratory effort without tachypnea/retractions. Breath sounds are clear and equal bilaterally. No wheezes/rales/rhonchi. Gastrointestinal:   Soft with mild right lower quadrant tenderness. Non distended. There is no CVA tenderness.  No rebound, rigidity, or guarding. Genitourinary:   deferred Musculoskeletal:   Normal range of motion in all extremities. No joint effusions.  No lower extremity tenderness.  No edema. Neurologic:   Normal speech and language.  Motor grossly intact. No gross focal neurologic deficits are  appreciated.  Skin:    Skin is warm, dry and intact. No rash noted.  No petechiae, purpura, or bullae.  ____________________________________________    LABS (pertinent positives/negatives) (all labs ordered are listed, but only abnormal results are displayed) Labs Reviewed  COMPREHENSIVE METABOLIC PANEL - Abnormal; Notable for the following:       Result Value   Glucose, Bld 125 (*)    All other components within normal limits  CBC - Abnormal; Notable for the following:    RDW 14.8 (*)    All other components within normal limits  URINALYSIS, COMPLETE (UACMP) WITH MICROSCOPIC - Abnormal; Notable for the following:    Color, Urine AMBER (*)    APPearance CLEAR (*)  Protein, ur 30 (*)    Leukocytes, UA SMALL (*)    Squamous Epithelial / LPF 0-5 (*)    All other components within normal limits  LIPASE, BLOOD   ____________________________________________   EKG    ____________________________________________    RADIOLOGY  Ct Abdomen Pelvis W Contrast  Result Date: 04/18/2017 CLINICAL DATA:  Right lower quadrant abdominal pain for 2 days. EXAM: CT ABDOMEN AND PELVIS WITH CONTRAST TECHNIQUE: Multidetector CT imaging of the abdomen and pelvis was performed using the standard protocol following bolus administration of intravenous contrast. CONTRAST:  138mL ISOVUE-300 IOPAMIDOL (ISOVUE-300) INJECTION 61% COMPARISON:  CT scan 02/02/2012 FINDINGS: Lower chest: The lung bases are clear except for minimal dependent atelectasis. The heart is normal in size. No pericardial effusion. Moderate atherosclerotic calcifications involving the distal descending thoracic aorta. No aneurysm or dissection. Small hiatal hernia. Hepatobiliary: No focal hepatic lesions or intrahepatic biliary dilatation. Small left hepatic lobe cyst is stable. The gallbladder is surgically absent. No common bile duct dilatation. Pancreas: Stable mild fatty change involving the pancreatic head. No mass, inflammation or  ductal dilatation. There is a large duodenum diverticulum near the pancreatic head. Spleen: Normal size.  No focal lesions. Adrenals/Urinary Tract: The adrenal glands are normal. No renal, ureteral or bladder calculi or mass. Small renal cysts are noted. Stomach/Bowel: The stomach, duodenum, and small bowel are unremarkable. The terminal ileum is normal. The appendix is normal. There are changes of acute diverticulitis involving the mid sigmoid colon without complicating features such as abscess or free air. Extensive sigmoid diverticulosis. Remainder of the colon is unremarkable. The patient has had a moderate-sized anterior abdominal wall hernia containing part of the transverse colon but no incarceration or obstruction. Vascular/Lymphatic: Advanced atherosclerotic calcifications involving the abdominal aorta but no aneurysm or dissection. The branch vessels are patent. The major venous structures are patent no mesenteric or retroperitoneal mass or adenopathy. Small scattered lymph nodes are stable. Reproductive: Uterine fibroids are again demonstrated with a dominant 5 cm fibroid on the right side. The ovaries are normal and stable. Other: No pelvic mass or adenopathy. No free pelvic fluid collections. No inguinal mass or adenopathy. No abdominal wall hernia or subcutaneous lesions. Musculoskeletal: No acute bony findings. Degenerative changes noted involving the thoracic and lumbar spine. IMPRESSION: 1. Acute uncomplicated sigmoid diverticulitis. 2. Anterior abdominal wall hernia containing part of the transverse colon but no incarceration or obstruction. 3. Status post cholecystectomy.  No biliary dilatation. 4. Large duodenum diverticulum. 5. Advanced atherosclerotic calcifications involving the descending thoracic aorta and abdominal aorta but no aneurysm. 6. Uterine fibroids. Electronically Signed   By: Marijo Sanes M.D.   On: 04/18/2017 12:41     ____________________________________________   PROCEDURES Procedures  ____________________________________________   INITIAL IMPRESSION / ASSESSMENT AND PLAN / ED COURSE  Pertinent labs & imaging results that were available during my care of the patient were reviewed by me and considered in my medical decision making (see chart for details).  Patient presents with right lower quadrant pain and tenderness. Afebrile, normal labs. We'll get a CT scan to evaluate for appendicitis. Well appearing and nontoxic, declines pain and nausea medicine at this time.  Clinical Course as of Apr 18 1254  Fri Apr 18, 2017  1248 Ua neg  [PS]  1249 CT c/w sig. Diverticulitis. Will give cipro/flagyl, zofran, f/u pcp.   [PS]    Clinical Course User Index [PS] Carrie Mew, MD     ----------------------------------------- 12:55 PM on 04/18/2017 -----------------------------------------  Considering the patient's  symptoms, medical history, and physical examination today, I have low suspicion for cholecystitis or biliary pathology, pancreatitis, perforation or bowel obstruction, hernia, intra-abdominal abscess, AAA or dissection, volvulus or intussusception, mesenteric ischemia, or appendicitis.    ____________________________________________   FINAL CLINICAL IMPRESSION(S) / ED DIAGNOSES  Final diagnoses:  Diverticulitis large intestine w/o perforation or abscess w/o bleeding  Right lower quadrant abdominal pain    New Prescriptions   CIPROFLOXACIN (CIPRO) 500 MG TABLET    Take 1 tablet (500 mg total) by mouth 2 (two) times daily.   METRONIDAZOLE (FLAGYL) 500 MG TABLET    Take 1 tablet (500 mg total) by mouth 3 (three) times daily.   NAPROXEN (NAPROSYN) 500 MG TABLET    Take 1 tablet (500 mg total) by mouth 2 (two) times daily with a meal.   ONDANSETRON (ZOFRAN ODT) 4 MG DISINTEGRATING TABLET    Take 1 tablet (4 mg total) by mouth every 8 (eight) hours as needed for nausea or  vomiting.     Portions of this note were generated with dragon dictation software. Dictation errors may occur despite best attempts at proofreading.    Carrie Mew, MD 04/18/17 1255

## 2017-04-18 NOTE — ED Triage Notes (Signed)
Patient arrives to [facility] ED via POV with c/o abdominal pain. Sent from [facility]), with rebound tenderness, needing eval for possible Appy.

## 2017-04-18 NOTE — ED Notes (Signed)
Amy RN aware of patient placement.

## 2017-04-18 NOTE — Discharge Instructions (Signed)
Your CT scan showed you have diverticulitis of the sigmoid colon (near the rectum).  Your appendix is normal today.  Follow up with your doctor for continued monitoring of your symptoms.

## 2017-04-18 NOTE — ED Notes (Signed)
Called CT to inform patient finished drinking contrast.  Patient also given a warm blanket.

## 2017-04-18 NOTE — ED Notes (Addendum)
Patient given contrast to drink from CT.

## 2017-06-25 ENCOUNTER — Encounter: Payer: Self-pay | Admitting: *Deleted

## 2017-06-26 ENCOUNTER — Ambulatory Visit: Payer: Medicare Other | Admitting: Anesthesiology

## 2017-06-26 ENCOUNTER — Encounter
Admission: RE | Disposition: A | Discharge status: Home / Self Care | Payer: Self-pay | Source: Ambulatory Visit | Attending: Gastroenterology

## 2017-06-26 ENCOUNTER — Ambulatory Visit
Admission: RE | Admit: 2017-06-26 | Discharge: 2017-06-26 | Disposition: A | Discharge status: Home / Self Care | Payer: Medicare Other | Source: Ambulatory Visit | Attending: Gastroenterology | Admitting: Gastroenterology

## 2017-06-26 DIAGNOSIS — M159 Polyosteoarthritis, unspecified: Secondary | ICD-10-CM | POA: Insufficient documentation

## 2017-06-26 DIAGNOSIS — Z79899 Other long term (current) drug therapy: Secondary | ICD-10-CM | POA: Diagnosis not present

## 2017-06-26 DIAGNOSIS — K219 Gastro-esophageal reflux disease without esophagitis: Secondary | ICD-10-CM | POA: Diagnosis not present

## 2017-06-26 DIAGNOSIS — M109 Gout, unspecified: Secondary | ICD-10-CM | POA: Insufficient documentation

## 2017-06-26 DIAGNOSIS — I129 Hypertensive chronic kidney disease with stage 1 through stage 4 chronic kidney disease, or unspecified chronic kidney disease: Secondary | ICD-10-CM | POA: Diagnosis not present

## 2017-06-26 DIAGNOSIS — K5730 Diverticulosis of large intestine without perforation or abscess without bleeding: Secondary | ICD-10-CM | POA: Insufficient documentation

## 2017-06-26 DIAGNOSIS — E7800 Pure hypercholesterolemia, unspecified: Secondary | ICD-10-CM | POA: Diagnosis not present

## 2017-06-26 DIAGNOSIS — F329 Major depressive disorder, single episode, unspecified: Secondary | ICD-10-CM | POA: Diagnosis not present

## 2017-06-26 DIAGNOSIS — Z09 Encounter for follow-up examination after completed treatment for conditions other than malignant neoplasm: Secondary | ICD-10-CM | POA: Diagnosis present

## 2017-06-26 DIAGNOSIS — Z85828 Personal history of other malignant neoplasm of skin: Secondary | ICD-10-CM | POA: Diagnosis not present

## 2017-06-26 DIAGNOSIS — Z885 Allergy status to narcotic agent status: Secondary | ICD-10-CM | POA: Diagnosis not present

## 2017-06-26 DIAGNOSIS — K429 Umbilical hernia without obstruction or gangrene: Secondary | ICD-10-CM | POA: Insufficient documentation

## 2017-06-26 DIAGNOSIS — N189 Chronic kidney disease, unspecified: Secondary | ICD-10-CM | POA: Diagnosis not present

## 2017-06-26 DIAGNOSIS — G4730 Sleep apnea, unspecified: Secondary | ICD-10-CM | POA: Insufficient documentation

## 2017-06-26 DIAGNOSIS — Z9989 Dependence on other enabling machines and devices: Secondary | ICD-10-CM | POA: Diagnosis not present

## 2017-06-26 DIAGNOSIS — Z86010 Personal history of colonic polyps: Secondary | ICD-10-CM | POA: Insufficient documentation

## 2017-06-26 DIAGNOSIS — Z8719 Personal history of other diseases of the digestive system: Secondary | ICD-10-CM | POA: Diagnosis not present

## 2017-06-26 HISTORY — DX: Polyosteoarthritis, unspecified: M15.9

## 2017-06-26 HISTORY — PX: COLONOSCOPY WITH PROPOFOL: SHX5780

## 2017-06-26 HISTORY — DX: Chronic kidney disease, unspecified: N18.9

## 2017-06-26 HISTORY — DX: Disorder of kidney and ureter, unspecified: N28.9

## 2017-06-26 HISTORY — DX: Gastro-esophageal reflux disease without esophagitis: K21.9

## 2017-06-26 SURGERY — COLONOSCOPY WITH PROPOFOL
Anesthesia: General

## 2017-06-26 MED ORDER — PROPOFOL 500 MG/50ML IV EMUL
INTRAVENOUS | Status: AC
  Filled 2017-06-26: qty 50

## 2017-06-26 MED ORDER — LIDOCAINE 2% (20 MG/ML) 5 ML SYRINGE
INTRAMUSCULAR | Status: DC | PRN
  Administered 2017-06-26: 40 mg via INTRAVENOUS

## 2017-06-26 MED ORDER — FENTANYL CITRATE (PF) 100 MCG/2ML IJ SOLN
INTRAMUSCULAR | Status: DC | PRN
  Administered 2017-06-26 (×2): 50 ug via INTRAVENOUS

## 2017-06-26 MED ORDER — SODIUM CHLORIDE 0.9 % IV SOLN
INTRAVENOUS | Status: DC
  Administered 2017-06-26: 14:00:00 via INTRAVENOUS

## 2017-06-26 MED ORDER — FENTANYL CITRATE (PF) 100 MCG/2ML IJ SOLN
INTRAMUSCULAR | Status: AC
  Filled 2017-06-26: qty 2

## 2017-06-26 MED ORDER — PROPOFOL 10 MG/ML IV BOLUS
INTRAVENOUS | Status: DC | PRN
  Administered 2017-06-26: 100 mg via INTRAVENOUS

## 2017-06-26 MED ORDER — PROPOFOL 500 MG/50ML IV EMUL
INTRAVENOUS | Status: DC | PRN
Start: 2017-06-26 — End: 2017-06-26
  Administered 2017-06-26: 160 ug/kg/min via INTRAVENOUS

## 2017-06-26 NOTE — Anesthesia Preprocedure Evaluation (Signed)
Anesthesia Evaluation  Patient identified by MRN, date of birth, ID band Patient awake    Reviewed: Allergy & Precautions, NPO status , Patient's Chart, lab work & pertinent test results  History of Anesthesia Complications Negative for: history of anesthetic complications  Airway Mallampati: IV       Dental   Pulmonary sleep apnea and Continuous Positive Airway Pressure Ventilation , neg COPD,           Cardiovascular hypertension, Pt. on medications (-) Past MI and (-) CHF (-) dysrhythmias (-) Valvular Problems/Murmurs     Neuro/Psych neg Seizures Depression    GI/Hepatic Neg liver ROS, GERD  Medicated and Poorly Controlled,  Endo/Other  neg diabetesHypothyroidism   Renal/GU Renal InsufficiencyRenal disease     Musculoskeletal   Abdominal   Peds  Hematology   Anesthesia Other Findings   Reproductive/Obstetrics                             Anesthesia Physical Anesthesia Plan  ASA: III  Anesthesia Plan: General   Post-op Pain Management:    Induction:   PONV Risk Score and Plan: Propofol infusion  Airway Management Planned: Nasal Cannula  Additional Equipment:   Intra-op Plan:   Post-operative Plan:   Informed Consent: I have reviewed the patients History and Physical, chart, labs and discussed the procedure including the risks, benefits and alternatives for the proposed anesthesia with the patient or authorized representative who has indicated his/her understanding and acceptance.     Plan Discussed with:   Anesthesia Plan Comments:         Anesthesia Quick Evaluation

## 2017-06-26 NOTE — Anesthesia Post-op Follow-up Note (Signed)
Anesthesia QCDR form completed.        

## 2017-06-26 NOTE — Op Note (Signed)
[facility] Patient Name: Theresa Dodson Procedure Date: 06/26/2017 2:12 PM MRN: [id number] Account #: [id number] Date of Birth: 02/19/1946 Admit Type: Outpatient Age: 71 Room: [facility] ENDO ROOM 1 Gender: Female Note Status: Finalized Procedure:            Colonoscopy Indications:          Personal history of colonic polyps, Follow-up of                        diverticulitis Providers:            Lollie Sails, MD Referring MD:         Gayland Curry MD, MD (Referring MD) Medicines:            Monitored Anesthesia Care Complications:        No immediate complications. Procedure:            Pre-Anesthesia Assessment:                       - ASA Grade Assessment: III - A patient with severe                        systemic disease.                       After obtaining informed consent, the colonoscope was                        passed under direct vision. Throughout the procedure,                        the patient's blood pressure, pulse, and oxygen                        saturations were monitored continuously. The Olympus                        PCF-H180AL colonoscope ( S#: [id number] ) was introduced                        through the anus and advanced to the the transverse                        colon for evaluation. Findings:      Multiple medium-mouthed diverticula were found in the sigmoid colon and       descending colon.      A single medium-mouthed diverticulum was found at 35 cm proximal to the       anus, Inverted, not inflamed.      The retroflexed view of the distal rectum and anal verge was normal and       showed no anal or rectal abnormalities.      The digital rectal exam was normal.      The location of the umbilical hernia was noted when the scope was at       about 65 cm from the anal verge, the colon here being possibly in the       hernia, noted by transillumination at the umbilical hernia site. I was       unable to pass  beyond this location due to an acute narrowing or       "  pinched" effect on the lumen. Impression:           - Diverticulosis in the sigmoid colon and in the                        descending colon.                       - Diverticulosis at 35 cm proximal to the anus.                       - The distal rectum and anal verge are normal on                        retroflexion view.                       - No specimens collected. Recommendation:       - Discharge patient to home.                       - Advance diet as tolerated.                       - Refer to a surgeon to be scheduled for surgical                        opinion of umbilical hernia. Procedure Code(s):    --- Professional ---                       [id number], 59, Colonoscopy, flexible; diagnostic, including                        collection of specimen(s) by brushing or washing, when                        performed (separate procedure) Diagnosis Code(s):    --- Professional ---                       Z86.010, Personal history of colonic polyps                       K57.32, Diverticulitis of large intestine without                        perforation or abscess without bleeding                       K57.30, Diverticulosis of large intestine without                        perforation or abscess without bleeding CPT copyright 2016 [employer] Association. All rights reserved. The codes documented in this report are preliminary and upon coder review may  be revised to meet current compliance requirements. Lollie Sails, MD 06/26/2017 2:59:42 PM This report has been signed electronically. Number of Addenda: 0 Note Initiated On: 06/26/2017 2:12 PM Scope Withdrawal Time: 0 hours 5 minutes 53 seconds  Total Procedure Duration: 0 hours 21 minutes 42 seconds       [facility]

## 2017-06-26 NOTE — H&P (Signed)
Outpatient short stay form Pre-procedure 06/26/2017 2:10 PM Lollie Sails MD  Primary Physician: Dr. Gayland Curry  Reason for visit:  Colonoscopy  History of present illness:  Patient is a 71 year old female presenting today as above. She has a personal history of adenomatous colon polyps. She did have a CT scan about a month and a half ago with a complaint of lower abdominal discomfort. Her CT scan did show a acute uncomplicated sigmoid diverticulitis. So has a anterior abdominal wall hernia that at that time was containing part of transverse colon but no evidence of incarceration or obstruction. That seems of resolve however.  She did have some emesis with the prep last night but states she is clear this am. She takes no aspirin or blood thinning agents.  Current Facility-Administered Medications:  .  0.9 %  sodium chloride infusion, , Intravenous, Continuous, Lollie Sails, MD  Medications Prior to Admission  Medication Sig Dispense Refill Last Dose  . acetaminophen (TYLENOL) 650 MG CR tablet Take 650 mg by mouth every 8 (eight) hours as needed for pain.   Past Week at Unknown time  . allopurinol (ZYLOPRIM) 100 MG tablet Take 100 mg by mouth daily.   06/25/2017 at Unknown time  . allopurinol (ZYLOPRIM) 300 MG tablet Take 300 mg by mouth daily.   06/25/2017 at Unknown time  . atorvastatin (LIPITOR) 10 MG tablet Take 10 mg by mouth daily.   06/25/2017 at Unknown time  . co-enzyme Q-10 50 MG capsule Take 50 mg by mouth 2 (two) times daily.   Past Month at Unknown time  . colchicine 0.6 MG tablet Take 0.6 mg by mouth daily.   Past Month at Unknown time  . cyanocobalamin 1000 MCG tablet Take 100 mcg by mouth daily.   Past Month at Unknown time  . fluticasone (FLONASE) 50 MCG/ACT nasal spray Place 1 spray into both nostrils daily. For nasal congestion. (Patient taking differently: Place 2 sprays into both nostrils daily. For nasal congestion.) 1 g 1 Past Month at Unknown time  .  gabapentin (NEURONTIN) 600 MG tablet Take 300 mg by mouth 2 (two) times daily.    Past Month at Unknown time  . lisinopril (PRINIVIL,ZESTRIL) 10 MG tablet Take 10 mg by mouth daily.   06/26/2017 at Unknown time  . naproxen (NAPROSYN) 500 MG tablet Take 1 tablet (500 mg total) by mouth 2 (two) times daily with a meal. 20 tablet 0 Past Month at Unknown time  . omega-3 acid ethyl esters (LOVAZA) 1 G capsule Take 2 g by mouth 2 (two) times daily.    Past Week at Unknown time     Allergies  Allergen Reactions  . Codeine      Past Medical History:  Diagnosis Date  . Cancer ([place])    skin ca  . Chronic renal insufficiency   . Diverticulitis   . Generalized osteoarthritis   . GERD (gastroesophageal reflux disease)   . Gout   . Gout   . Hypercholesteremia   . Hypertension     Review of systems:      Physical Exam    Heart and lungs: Regular rate and rhythm without rub or gallop, lungs are bilaterally clear    HEENT: Normocephalic atraumatic eyes are anicteric    Other:     Pertinant exam for procedure: Soft nontender, protuberant/obese, she does have a umbilical hernia that is more prominent on the right and super the local area and there is some mild erythema at  the bottom of this however she states this is nontender. She states that does not give her any tenderness. This has been present for over 10 years patient states.    Planned proceedures: Colonoscopy and indicated procedures. I have discussed the risks benefits and complications of procedures to include not limited to bleeding, infection, perforation and the risk of sedation and the patient wishes to proceed.    Lollie Sails, MD Gastroenterology 06/26/2017  2:10 PM

## 2017-06-26 NOTE — Anesthesia Postprocedure Evaluation (Signed)
Anesthesia Post Note  Patient: Theresa Dodson  Procedure(s) Performed: COLONOSCOPY WITH PROPOFOL (N/A )  Patient location during evaluation: Endoscopy Anesthesia Type: General Level of consciousness: awake and alert Pain management: pain level controlled Vital Signs Assessment: post-procedure vital signs reviewed and stable Respiratory status: spontaneous breathing and respiratory function stable Cardiovascular status: stable Anesthetic complications: no     Last Vitals:  Vitals:   06/26/17 1454 06/26/17 1456  BP:  125/60  Pulse:  82  Resp:  (!) 21  Temp: (!) 35.9 C (!) 35.9 C  SpO2:  99%    Last Pain:  Vitals:   06/26/17 1454  TempSrc: Tympanic                 KEPHART,WILLIAM K

## 2017-06-26 NOTE — Transfer of Care (Signed)
Immediate Anesthesia Transfer of Care Note  Patient: Theresa Dodson  Procedure(s) Performed: COLONOSCOPY WITH PROPOFOL (N/A )  Patient Location: PACU and Endoscopy Unit  Anesthesia Type:General  Level of Consciousness: awake, drowsy and patient cooperative  Airway & Oxygen Therapy: Patient Spontanous Breathing and Patient connected to nasal cannula oxygen  Post-op Assessment: Report given to RN and Post -op Vital signs reviewed and stable  Post vital signs: Reviewed and stable  Last Vitals:  Vitals:   06/26/17 1230  BP: (!) 131/58  Pulse: 86  Resp: 14  Temp: 36.8 C  SpO2: 98%    Last Pain: There were no vitals filed for this visit.       Complications: No apparent anesthesia complications

## 2017-06-27 ENCOUNTER — Encounter: Payer: Self-pay | Admitting: Gastroenterology

## 2017-07-29 ENCOUNTER — Other Ambulatory Visit: Payer: Self-pay

## 2017-07-29 DIAGNOSIS — J309 Allergic rhinitis, unspecified: Secondary | ICD-10-CM | POA: Insufficient documentation

## 2017-07-29 DIAGNOSIS — E785 Hyperlipidemia, unspecified: Secondary | ICD-10-CM | POA: Insufficient documentation

## 2017-07-29 DIAGNOSIS — M549 Dorsalgia, unspecified: Secondary | ICD-10-CM | POA: Insufficient documentation

## 2017-07-29 DIAGNOSIS — Z8719 Personal history of other diseases of the digestive system: Secondary | ICD-10-CM | POA: Insufficient documentation

## 2017-07-29 DIAGNOSIS — M159 Polyosteoarthritis, unspecified: Secondary | ICD-10-CM | POA: Insufficient documentation

## 2017-07-31 ENCOUNTER — Encounter: Payer: Self-pay | Admitting: General Surgery

## 2017-07-31 ENCOUNTER — Telehealth: Payer: Self-pay

## 2017-07-31 ENCOUNTER — Ambulatory Visit (INDEPENDENT_AMBULATORY_CARE_PROVIDER_SITE_OTHER): Payer: Medicare Other | Admitting: General Surgery

## 2017-07-31 VITALS — BP 168/99 | HR 84 | Temp 97.8°F | Ht 63.0 in | Wt 214.0 lb

## 2017-07-31 DIAGNOSIS — K432 Incisional hernia without obstruction or gangrene: Secondary | ICD-10-CM | POA: Diagnosis not present

## 2017-07-31 NOTE — Patient Instructions (Signed)
We have your surgery scheduled for 08/13/17 at [facility] with Dr.Woodham.  Please see your blue-pre-care sheet for surgery information. Please call our office if you have any questions or concerns.  Umbilical Hernia, Adult A hernia is a bulge of tissue that pushes through an opening between muscles. An umbilical hernia happens in the abdomen, near the belly button (umbilicus). The hernia may contain tissues from the small intestine, large intestine, or fatty tissue covering the intestines (omentum). Umbilical hernias in adults tend to get worse over time, and they require surgical treatment. There are several types of umbilical hernias. You may have:  A hernia located just above or below the umbilicus (indirect hernia). This is the most common type of umbilical hernia in adults.  A hernia that forms through an opening formed by the umbilicus (direct hernia).  A hernia that comes and goes (reducible hernia). A reducible hernia may be visible only when you strain, lift something heavy, or cough. This type of hernia can be pushed back into the abdomen (reduced).  A hernia that traps abdominal tissue inside the hernia (incarcerated hernia). This type of hernia cannot be reduced.  A hernia that cuts off blood flow to the tissues inside the hernia (strangulated hernia). The tissues can start to die if this happens. This type of hernia requires emergency treatment.  What are the causes? An umbilical hernia happens when tissue inside the abdomen presses on a weak area of the abdominal muscles. What increases the risk? You may have a greater risk of this condition if you:  Are obese.  Have had several pregnancies.  Have a buildup of fluid inside your abdomen (ascites).  Have had surgery that weakens the abdominal muscles.  What are the signs or symptoms? The main symptom of this condition is a painless bulge at or near the belly button. A reducible hernia may be visible only when you  strain, lift something heavy, or cough. Other symptoms may include:  Dull pain.  A feeling of pressure.  Symptoms of a strangulated hernia may include:  Pain that gets increasingly worse.  Nausea and vomiting.  Pain when pressing on the hernia.  Skin over the hernia becoming red or purple.  Constipation.  Blood in the stool.  How is this diagnosed? This condition may be diagnosed based on:  A physical exam. You may be asked to cough or strain while standing. These actions increase the pressure inside your abdomen and force the hernia through the opening in your muscles. Your health care provider may try to reduce the hernia by pressing on it.  Your symptoms and medical history.  How is this treated? Surgery is the only treatment for an umbilical hernia. Surgery for a strangulated hernia is done as soon as possible. If you have a small hernia that is not incarcerated, you may need to lose weight before having surgery. Follow these instructions at home:  Lose weight, if told by your health care provider.  Do not try to push the hernia back in.  Watch your hernia for any changes in color or size. Tell your health care provider if any changes occur.  You may need to avoid activities that increase pressure on your hernia.  Do not lift anything that is heavier than 10 lb (4.5 kg) until your health care provider says that this is safe.  Take over-the-counter and prescription medicines only as told by your health care provider.  Keep all follow-up visits as told by your health care  provider. This is important. Contact a health care provider if:  Your hernia gets larger.  Your hernia becomes painful. Get help right away if:  You develop sudden, severe pain near the area of your hernia.  You have pain as well as nausea or vomiting.  You have pain and the skin over your hernia changes color.  You develop a fever. This information is not intended to replace advice given  to you by your health care provider. Make sure you discuss any questions you have with your health care provider. Document Released: 01/05/2016 Document Revised: 04/07/2016 Document Reviewed: 01/05/2016 Elsevier Interactive Patient Education  [employer].

## 2017-07-31 NOTE — Telephone Encounter (Signed)
Medical Clearance faxed to Duaine Dredge at this time.

## 2017-08-01 ENCOUNTER — Encounter: Payer: Self-pay | Admitting: General Surgery

## 2017-08-01 NOTE — H&P (View-Only) (Signed)
Patient ID: Theresa Dodson, female   DOB: 05/21/1946, 71 y.o.   MRN: [id number]  CC: Hernia  HPI Theresa Dodson is a 71 y.o. female who presents to clinic today for evaluation of a hernia.  Consultation was requested by Dr. Gustavo Lah as the hernia prevented him from completing a colonoscopy.  Patient reports that she has had a hernia there for many years.  The hernia is at the site of a single incision laparoscopic cholecystectomy that she had many years ago.  Recently it has become larger and more symptomatically.  Primarily her symptoms only that she cannot lay on her stomach due to discomfort.  It is never been hard or change colors.  She has no changes in bowel habits.  She denies any recent fevers, chills, nausea, vomiting, chest pain, shortness of breath.  HPI  Past Medical History:  Diagnosis Date  . Cancer ([place])    skin ca  . Chronic renal insufficiency   . Diverticulitis   . Generalized osteoarthritis   . GERD (gastroesophageal reflux disease)   . Gout   . Gout   . Hypercholesteremia   . Hypertension     Past Surgical History:  Procedure Laterality Date  . breast benign mass removal  2012  . BREAST BIOPSY Right 07/2011   NEG  . CARPAL TUNNEL RELEASE Bilateral   . CHOLECYSTECTOMY    . COLONOSCOPY WITH PROPOFOL N/A 06/26/2017   Procedure: COLONOSCOPY WITH PROPOFOL;  Surgeon: Lollie Sails, MD;  Location: [facility] ENDOSCOPY;  Service: Endoscopy;  Laterality: N/A;  . FOOT SURGERY     d/t gout  . MANDIBLE SURGERY    . tooth abscess    . WRIST SURGERY      Family History  Problem Relation Age of Onset  . Ovarian cancer Mother   . Stroke Father   . Hypertension Sister   . Heart disease Maternal Grandmother   . Heart disease Maternal Grandfather   . Heart disease Paternal Grandmother   . Diabetes Paternal Grandfather   . Breast cancer Neg Hx     Social History Social History   Tobacco Use  . Smoking status: Never Smoker  . Smokeless tobacco: Never Used   Substance Use Topics  . Alcohol use: No  . Drug use: Not on file    Allergies  Allergen Reactions  . Codeine     Current Outpatient Medications  Medication Sig Dispense Refill  . acetaminophen (TYLENOL) 650 MG CR tablet Take 650 mg by mouth every 8 (eight) hours as needed for pain.    [name] allopurinol (ZYLOPRIM) 100 MG tablet Take 100 mg by mouth daily.    [name] allopurinol (ZYLOPRIM) 300 MG tablet Take 300 mg by mouth daily.    [name] aspirin EC 81 MG tablet Take 81 mg by mouth daily.    [name] atorvastatin (LIPITOR) 10 MG tablet Take 10 mg by mouth daily.    [name] BACILLUS COAGULANS-INULIN PO Take 1 Dose by mouth 1 day or 1 dose.    [name] co-enzyme Q-10 50 MG capsule Take 50 mg by mouth 2 (two) times daily.    . colchicine 0.6 MG tablet Take 0.6 mg by mouth daily.    . cyanocobalamin 1000 MCG tablet Take 100 mcg by mouth daily.    . famotidine (PEPCID) 20 MG tablet Take 1 tablet by mouth 2 (two) times daily.    [name] gabapentin (NEURONTIN) 600 MG tablet Take 300 mg by mouth 2 (two) times daily.     [name]  hydrochlorothiazide (HYDRODIURIL) 25 MG tablet Take 1 tablet by mouth 1 day or 1 dose.    [name] lisinopril (PRINIVIL,ZESTRIL) 10 MG tablet Take 10 mg by mouth daily.    [name] omega-3 acid ethyl esters (LOVAZA) 1 G capsule Take 2 g by mouth 2 (two) times daily.      No current facility-administered medications for this visit.      Review of Systems A multi-point review of systems was asked and was negative except for the findings documented in the HPI  Physical Exam Blood pressure (!) 168/99, pulse 84, temperature 97.8 F (36.6 C), temperature source Oral, height 5\' 3"  (1.6 m), weight 97.1 kg (214 lb). CONSTITUTIONAL: No acute distress. EYES: Pupils are equal, round, and reactive to light, Sclera are non-icteric. EARS, NOSE, MOUTH AND THROAT: The oropharynx is clear. The oral mucosa is pink and moist. Hearing is intact to voice. LYMPH NODES:  Lymph nodes in the neck are normal. RESPIRATORY:  Lungs are clear.  There is normal respiratory effort, with equal breath sounds bilaterally, and without pathologic use of accessory muscles. CARDIOVASCULAR: Heart is regular without murmurs, gallops, or rubs. GI: The abdomen is large, soft, nontender, and nondistended. There is an obvious hernia at the area of the umbilicus with a scar from the incision at the bottom of it. There is no hepatosplenomegaly. There are normal bowel sounds in all quadrants. GU: Rectal deferred.   MUSCULOSKELETAL: Normal muscle strength and tone. No cyanosis or edema.   SKIN: Turgor is good and there are no pathologic skin lesions or ulcers. NEUROLOGIC: Motor and sensation is grossly normal. Cranial nerves are grossly intact. PSYCH:  Oriented to person, place and time. Affect is normal.  Data Reviewed Images and labs reviewed, most recent labs are from 3 months ago which were within normal limits.  She had a CT scan performed at that time which showed the large hernia with a proximally 4 cm greatest diameter that is containing a loop of the transverse colon.  There were no signs of obstruction at that time. I have personally reviewed the patient's imaging, laboratory findings and medical records.    Assessment    Incisional hernia    Plan    71 year old female with an incisional hernia from her single incision laparoscopic surgery that was performed for her gallbladder.  It never completely reduces.  Discussed the treatment options of open versus laparoscopic hernia repair.  Discussed the risk, benefits, alternatives of each.  After this long conversation patient elects to proceed with an open repair.  Discussed that she is at high risk for the surgery secondary to her large abdominal girth.  Discussed the risk being bleeding, infection, recurrence, damage to the colon that is protruding, need for additional surgeries or prolonged hospital stay.  She voiced understanding and agrees to proceed.  We will plan to take her to the operating  room on 26 December.     Time spent with the patient was 45 minutes, with more than 50% of the time spent in face-to-face education, counseling and care coordination.     Clayburn Pert, MD FACS General Surgeon 08/01/2017, 8:48 AM

## 2017-08-01 NOTE — Progress Notes (Signed)
Patient ID: Theresa Dodson, female   DOB: Mar 24, 1946, 71 y.o.   MRN: [id number]  CC: Hernia  HPI Theresa Dodson is a 71 y.o. female who presents to clinic today for evaluation of a hernia.  Consultation was requested by Dr. Gustavo Lah as the hernia prevented him from completing a colonoscopy.  Patient reports that she has had a hernia there for many years.  The hernia is at the site of a single incision laparoscopic cholecystectomy that she had many years ago.  Recently it has become larger and more symptomatically.  Primarily her symptoms only that she cannot lay on her stomach due to discomfort.  It is never been hard or change colors.  She has no changes in bowel habits.  She denies any recent fevers, chills, nausea, vomiting, chest pain, shortness of breath.  HPI  Past Medical History:  Diagnosis Date  . Cancer ([place])    skin ca  . Chronic renal insufficiency   . Diverticulitis   . Generalized osteoarthritis   . GERD (gastroesophageal reflux disease)   . Gout   . Gout   . Hypercholesteremia   . Hypertension     Past Surgical History:  Procedure Laterality Date  . breast benign mass removal  2012  . BREAST BIOPSY Right 07/2011   NEG  . CARPAL TUNNEL RELEASE Bilateral   . CHOLECYSTECTOMY    . COLONOSCOPY WITH PROPOFOL N/A 06/26/2017   Procedure: COLONOSCOPY WITH PROPOFOL;  Surgeon: Lollie Sails, MD;  Location: [facility] ENDOSCOPY;  Service: Endoscopy;  Laterality: N/A;  . FOOT SURGERY     d/t gout  . MANDIBLE SURGERY    . tooth abscess    . WRIST SURGERY      Family History  Problem Relation Age of Onset  . Ovarian cancer Mother   . Stroke Father   . Hypertension Sister   . Heart disease Maternal Grandmother   . Heart disease Maternal Grandfather   . Heart disease Paternal Grandmother   . Diabetes Paternal Grandfather   . Breast cancer Neg Hx     Social History Social History   Tobacco Use  . Smoking status: Never Smoker  . Smokeless tobacco: Never Used   Substance Use Topics  . Alcohol use: No  . Drug use: Not on file    Allergies  Allergen Reactions  . Codeine     Current Outpatient Medications  Medication Sig Dispense Refill  . acetaminophen (TYLENOL) 650 MG CR tablet Take 650 mg by mouth every 8 (eight) hours as needed for pain.    [name] allopurinol (ZYLOPRIM) 100 MG tablet Take 100 mg by mouth daily.    [name] allopurinol (ZYLOPRIM) 300 MG tablet Take 300 mg by mouth daily.    [name] aspirin EC 81 MG tablet Take 81 mg by mouth daily.    [name] atorvastatin (LIPITOR) 10 MG tablet Take 10 mg by mouth daily.    [name] BACILLUS COAGULANS-INULIN PO Take 1 Dose by mouth 1 day or 1 dose.    [name] co-enzyme Q-10 50 MG capsule Take 50 mg by mouth 2 (two) times daily.    . colchicine 0.6 MG tablet Take 0.6 mg by mouth daily.    . cyanocobalamin 1000 MCG tablet Take 100 mcg by mouth daily.    . famotidine (PEPCID) 20 MG tablet Take 1 tablet by mouth 2 (two) times daily.    [name] gabapentin (NEURONTIN) 600 MG tablet Take 300 mg by mouth 2 (two) times daily.     [name]  hydrochlorothiazide (HYDRODIURIL) 25 MG tablet Take 1 tablet by mouth 1 day or 1 dose.    [name] lisinopril (PRINIVIL,ZESTRIL) 10 MG tablet Take 10 mg by mouth daily.    [name] omega-3 acid ethyl esters (LOVAZA) 1 G capsule Take 2 g by mouth 2 (two) times daily.      No current facility-administered medications for this visit.      Review of Systems A multi-point review of systems was asked and was negative except for the findings documented in the HPI  Physical Exam Blood pressure (!) 168/99, pulse 84, temperature 97.8 F (36.6 C), temperature source Oral, height 5\' 3"  (1.6 m), weight 97.1 kg (214 lb). CONSTITUTIONAL: No acute distress. EYES: Pupils are equal, round, and reactive to light, Sclera are non-icteric. EARS, NOSE, MOUTH AND THROAT: The oropharynx is clear. The oral mucosa is pink and moist. Hearing is intact to voice. LYMPH NODES:  Lymph nodes in the neck are normal. RESPIRATORY:  Lungs are clear.  There is normal respiratory effort, with equal breath sounds bilaterally, and without pathologic use of accessory muscles. CARDIOVASCULAR: Heart is regular without murmurs, gallops, or rubs. GI: The abdomen is large, soft, nontender, and nondistended. There is an obvious hernia at the area of the umbilicus with a scar from the incision at the bottom of it. There is no hepatosplenomegaly. There are normal bowel sounds in all quadrants. GU: Rectal deferred.   MUSCULOSKELETAL: Normal muscle strength and tone. No cyanosis or edema.   SKIN: Turgor is good and there are no pathologic skin lesions or ulcers. NEUROLOGIC: Motor and sensation is grossly normal. Cranial nerves are grossly intact. PSYCH:  Oriented to person, place and time. Affect is normal.  Data Reviewed Images and labs reviewed, most recent labs are from 3 months ago which were within normal limits.  She had a CT scan performed at that time which showed the large hernia with a proximally 4 cm greatest diameter that is containing a loop of the transverse colon.  There were no signs of obstruction at that time. I have personally reviewed the patient's imaging, laboratory findings and medical records.    Assessment    Incisional hernia    Plan    71 year old female with an incisional hernia from her single incision laparoscopic surgery that was performed for her gallbladder.  It never completely reduces.  Discussed the treatment options of open versus laparoscopic hernia repair.  Discussed the risk, benefits, alternatives of each.  After this long conversation patient elects to proceed with an open repair.  Discussed that she is at high risk for the surgery secondary to her large abdominal girth.  Discussed the risk being bleeding, infection, recurrence, damage to the colon that is protruding, need for additional surgeries or prolonged hospital stay.  She voiced understanding and agrees to proceed.  We will plan to take her to the operating  room on 26 December.     Time spent with the patient was 45 minutes, with more than 50% of the time spent in face-to-face education, counseling and care coordination.     Clayburn Pert, MD FACS General Surgeon 08/01/2017, 8:48 AM

## 2017-08-05 ENCOUNTER — Telehealth: Payer: Self-pay | Admitting: General Surgery

## 2017-08-05 ENCOUNTER — Inpatient Hospital Stay: Admission: RE | Admit: 2017-08-05 | Payer: Medicare Other | Source: Ambulatory Visit

## 2017-08-05 DIAGNOSIS — E119 Type 2 diabetes mellitus without complications: Secondary | ICD-10-CM | POA: Insufficient documentation

## 2017-08-05 NOTE — Telephone Encounter (Signed)
Pt advised of pre op date/time and sx date. Sx: 08/13/17 with Dr Golden Circle incisional/umbilical hernia repair.  Pre op: 08/07/17 @ 8:00am--office interview.   Patient made aware to call [phone], between 1-3:00pm on 08/11/17, to find out what time to arrive.

## 2017-08-07 ENCOUNTER — Ambulatory Visit
Admission: RE | Admit: 2017-08-07 | Discharge: 2017-08-07 | Disposition: A | Discharge status: Home / Self Care | Payer: Medicare Other | Source: Ambulatory Visit | Attending: General Surgery | Admitting: General Surgery

## 2017-08-07 ENCOUNTER — Encounter
Admission: RE | Admit: 2017-08-07 | Discharge: 2017-08-07 | Disposition: A | Discharge status: Home / Self Care | Payer: Medicare Other | Source: Ambulatory Visit | Attending: General Surgery | Admitting: General Surgery

## 2017-08-07 ENCOUNTER — Other Ambulatory Visit: Payer: Self-pay

## 2017-08-07 ENCOUNTER — Telehealth: Payer: Self-pay

## 2017-08-07 DIAGNOSIS — K432 Incisional hernia without obstruction or gangrene: Secondary | ICD-10-CM | POA: Insufficient documentation

## 2017-08-07 DIAGNOSIS — I700 Atherosclerosis of aorta: Secondary | ICD-10-CM | POA: Insufficient documentation

## 2017-08-07 DIAGNOSIS — Z01812 Encounter for preprocedural laboratory examination: Secondary | ICD-10-CM | POA: Diagnosis present

## 2017-08-07 DIAGNOSIS — Z01818 Encounter for other preprocedural examination: Secondary | ICD-10-CM | POA: Insufficient documentation

## 2017-08-07 DIAGNOSIS — G709 Myoneural disorder, unspecified: Secondary | ICD-10-CM | POA: Diagnosis not present

## 2017-08-07 DIAGNOSIS — Z01810 Encounter for preprocedural cardiovascular examination: Secondary | ICD-10-CM

## 2017-08-07 DIAGNOSIS — K219 Gastro-esophageal reflux disease without esophagitis: Secondary | ICD-10-CM | POA: Insufficient documentation

## 2017-08-07 DIAGNOSIS — G4730 Sleep apnea, unspecified: Secondary | ICD-10-CM | POA: Insufficient documentation

## 2017-08-07 DIAGNOSIS — N289 Disorder of kidney and ureter, unspecified: Secondary | ICD-10-CM | POA: Diagnosis not present

## 2017-08-07 DIAGNOSIS — M1990 Unspecified osteoarthritis, unspecified site: Secondary | ICD-10-CM | POA: Diagnosis not present

## 2017-08-07 DIAGNOSIS — I10 Essential (primary) hypertension: Secondary | ICD-10-CM | POA: Insufficient documentation

## 2017-08-07 DIAGNOSIS — E039 Hypothyroidism, unspecified: Secondary | ICD-10-CM | POA: Insufficient documentation

## 2017-08-07 HISTORY — DX: Sleep apnea, unspecified: G47.30

## 2017-08-07 HISTORY — DX: Prediabetes: R73.03

## 2017-08-07 NOTE — Patient Instructions (Addendum)
Your procedure is scheduled on: Wednesday 08/13/17 Report to [facility]. 2ND FLOOR MEDICAL MALL ENTRANCE. To find out your arrival time please call [phone] between 1PM - 3PM on Monday 08/11/17.  Remember: Instructions that are not followed completely may result in serious medical risk, up to and including death, or upon the discretion of your surgeon and anesthesiologist your surgery may need to be rescheduled.    __X__ 1. Do not eat anything after midnight the night before your    procedure.  No gum chewing or hard candies.  You may drink clear   liquids up to 2 hours before you are scheduled to arrive at the   hospital for your procedure. Do not drink clear liquids within 2   hours of scheduled arrival to the hospital as this may lead to your   procedure being delayed or rescheduled.       Clear liquids include:   Water or Apple juice without pulp   Clear carbohydrate beverage such as Clearfast or Gatorade   Black coffee or Clear Tea (no milk, no creamer, do not add anything   to the coffee or tea)    Diabetics should only drink water   __X__ 2. No Alcohol for 24 hours before or after surgery.   ____ 3. Bring all medications with you on the day of surgery if instructed.    __X__ 4. Notify your doctor if there is any change in your medical condition     (cold, fever, infections).             __X___5. No smoking within 24 hours of your surgery.     Do not wear jewelry, make-up, hairpins, clips or nail polish.  Do not wear lotions, powders, or perfumes.   Do not shave 48 hours prior to surgery. Men may shave face and neck.  Do not bring valuables to the hospital.    [facility] is not responsible for any belongings or valuables.               Contacts, dentures or bridgework may not be worn into surgery.  Leave your suitcase in the car. After surgery it may be brought to your room.  For patients admitted to the hospital, discharge time is determined by your                 treatment team.   Patients discharged the day of surgery will not be allowed to drive home.   Please read over the following fact sheets that you were given:   MRSA Information   __X__ Take these medicines the morning of surgery with A SIP OF WATER:    1. FAMOTIDINE (PEPCID)  2. GABAPENTIN  3. ALLOPURINOL  4.  5.  6.  ____ Fleet Enema (as directed)   __X__ Use CHG Soap/SAGE wipes as directed  ____ Use inhalers on the day of surgery  ____ Stop metformin 2 days prior to surgery    ____ Take 1/2 of usual insulin dose the night before surgery and none on the morning of surgery.   __X__ Stop Coumadin/Plavix/aspirin on AS INSTRUCTED BY DOCTOR  __X__ Stop Anti-inflammatories such as Advil, Aleve, Ibuprofen, Motrin, Naproxen, Naprosyn, Goodies,powder, or aspirin products.  OK to take Tylenol.   __X__ Stop supplements, Vitamin E, Fish Oil until after surgery.    __X__ Bring C-Pap to the hospital.

## 2017-08-07 NOTE — Pre-Procedure Instructions (Addendum)
Patient seen by PMD and labs drawn 08/05/17 so not repeated at this visit. Ekg  Performed 05/12/17  NSR falls within anesthesia parameters, not repeated. Notified IT consultant at [facility]

## 2017-08-07 NOTE — Telephone Encounter (Signed)
Medical Clearance has been obtained and approved at this time. Medical Clearance can be found under Media Tab in patient's chart.

## 2017-08-12 MED ORDER — CEFAZOLIN SODIUM-DEXTROSE 2-4 GM/100ML-% IV SOLN
2.0000 g | INTRAVENOUS | Status: AC
  Administered 2017-08-13: 2 g via INTRAVENOUS

## 2017-08-13 ENCOUNTER — Encounter
Admission: RE | Disposition: A | Discharge status: Home / Self Care | Payer: Self-pay | Source: Ambulatory Visit | Attending: General Surgery

## 2017-08-13 ENCOUNTER — Ambulatory Visit: Payer: Medicare Other | Admitting: Anesthesiology

## 2017-08-13 ENCOUNTER — Other Ambulatory Visit: Payer: Self-pay

## 2017-08-13 ENCOUNTER — Encounter: Payer: Self-pay | Admitting: *Deleted

## 2017-08-13 ENCOUNTER — Ambulatory Visit
Admission: RE | Admit: 2017-08-13 | Discharge: 2017-08-13 | Disposition: A | Discharge status: Home / Self Care | Payer: Medicare Other | Source: Ambulatory Visit | Attending: General Surgery | Admitting: General Surgery

## 2017-08-13 DIAGNOSIS — Z85828 Personal history of other malignant neoplasm of skin: Secondary | ICD-10-CM | POA: Insufficient documentation

## 2017-08-13 DIAGNOSIS — E7800 Pure hypercholesterolemia, unspecified: Secondary | ICD-10-CM | POA: Insufficient documentation

## 2017-08-13 DIAGNOSIS — N189 Chronic kidney disease, unspecified: Secondary | ICD-10-CM | POA: Diagnosis not present

## 2017-08-13 DIAGNOSIS — Z9049 Acquired absence of other specified parts of digestive tract: Secondary | ICD-10-CM | POA: Insufficient documentation

## 2017-08-13 DIAGNOSIS — K219 Gastro-esophageal reflux disease without esophagitis: Secondary | ICD-10-CM | POA: Insufficient documentation

## 2017-08-13 DIAGNOSIS — M109 Gout, unspecified: Secondary | ICD-10-CM | POA: Diagnosis not present

## 2017-08-13 DIAGNOSIS — G4730 Sleep apnea, unspecified: Secondary | ICD-10-CM | POA: Diagnosis not present

## 2017-08-13 DIAGNOSIS — Z7951 Long term (current) use of inhaled steroids: Secondary | ICD-10-CM | POA: Diagnosis not present

## 2017-08-13 DIAGNOSIS — E039 Hypothyroidism, unspecified: Secondary | ICD-10-CM | POA: Insufficient documentation

## 2017-08-13 DIAGNOSIS — K432 Incisional hernia without obstruction or gangrene: Secondary | ICD-10-CM | POA: Diagnosis not present

## 2017-08-13 DIAGNOSIS — Z7982 Long term (current) use of aspirin: Secondary | ICD-10-CM | POA: Diagnosis not present

## 2017-08-13 DIAGNOSIS — Z79899 Other long term (current) drug therapy: Secondary | ICD-10-CM | POA: Insufficient documentation

## 2017-08-13 DIAGNOSIS — I129 Hypertensive chronic kidney disease with stage 1 through stage 4 chronic kidney disease, or unspecified chronic kidney disease: Secondary | ICD-10-CM | POA: Diagnosis not present

## 2017-08-13 HISTORY — PX: INCISIONAL HERNIA REPAIR: SHX193

## 2017-08-13 HISTORY — PX: UMBILICAL HERNIA REPAIR: SHX196

## 2017-08-13 HISTORY — DX: Failed or difficult intubation, initial encounter: T88.4XXA

## 2017-08-13 LAB — GLUCOSE, CAPILLARY
Glucose-Capillary: 131 mg/dL — ABNORMAL HIGH (ref 65–99)
Glucose-Capillary: 120 mg/dL — ABNORMAL HIGH (ref 65–99)

## 2017-08-13 SURGERY — REPAIR, HERNIA, INCISIONAL
Anesthesia: General | Wound class: Clean

## 2017-08-13 MED ORDER — CEFAZOLIN SODIUM-DEXTROSE 2-4 GM/100ML-% IV SOLN
INTRAVENOUS | Status: AC
Start: 2017-08-13 — End: 2017-08-13
  Filled 2017-08-13: qty 100

## 2017-08-13 MED ORDER — SODIUM CHLORIDE 0.9 % IJ SOLN
INTRAMUSCULAR | Status: AC
  Filled 2017-08-13: qty 50

## 2017-08-13 MED ORDER — MIDAZOLAM HCL 2 MG/2ML IJ SOLN
INTRAMUSCULAR | Status: DC | PRN
  Administered 2017-08-13: 2 mg via INTRAVENOUS

## 2017-08-13 MED ORDER — FENTANYL CITRATE (PF) 100 MCG/2ML IJ SOLN
INTRAMUSCULAR | Status: DC | PRN
  Administered 2017-08-13: 100 ug via INTRAVENOUS

## 2017-08-13 MED ORDER — CHLORHEXIDINE GLUCONATE CLOTH 2 % EX PADS: 6.0000 | MEDICATED_PAD | Freq: Once | CUTANEOUS | Status: DC

## 2017-08-13 MED ORDER — OXYCODONE HCL 5 MG/5ML PO SOLN: 5.0000 mg | Freq: Once | ORAL | Status: AC | PRN

## 2017-08-13 MED ORDER — ROCURONIUM BROMIDE 50 MG/5ML IV SOLN
INTRAVENOUS | Status: AC
  Filled 2017-08-13: qty 2

## 2017-08-13 MED ORDER — PROPOFOL 10 MG/ML IV BOLUS
INTRAVENOUS | Status: AC
  Filled 2017-08-13: qty 20

## 2017-08-13 MED ORDER — SUCCINYLCHOLINE CHLORIDE 20 MG/ML IJ SOLN
INTRAMUSCULAR | Status: DC | PRN
  Administered 2017-08-13: 140 mg via INTRAVENOUS

## 2017-08-13 MED ORDER — LIDOCAINE HCL 1 % IJ SOLN
INTRAMUSCULAR | Status: DC | PRN
  Administered 2017-08-13: 10 mL

## 2017-08-13 MED ORDER — LIDOCAINE HCL (CARDIAC) 20 MG/ML IV SOLN
INTRAVENOUS | Status: DC | PRN
  Administered 2017-08-13: 100 mg via INTRAVENOUS

## 2017-08-13 MED ORDER — ONDANSETRON HCL 4 MG/2ML IJ SOLN
INTRAMUSCULAR | Status: DC | PRN
  Administered 2017-08-13: 4 mg via INTRAVENOUS

## 2017-08-13 MED ORDER — ROCURONIUM 10MG/ML (10ML) SYRINGE FOR MEDFUSION PUMP - OPTIME: INTRAVENOUS | Status: DC | PRN

## 2017-08-13 MED ORDER — FENTANYL CITRATE (PF) 100 MCG/2ML IJ SOLN
INTRAMUSCULAR | Status: AC
  Filled 2017-08-13: qty 2

## 2017-08-13 MED ORDER — ACETAMINOPHEN 10 MG/ML IV SOLN
INTRAVENOUS | Status: AC
  Filled 2017-08-13: qty 100

## 2017-08-13 MED ORDER — PHENYLEPHRINE HCL 10 MG/ML IJ SOLN
INTRAMUSCULAR | Status: DC | PRN
  Administered 2017-08-13 (×7): 100 ug via INTRAVENOUS

## 2017-08-13 MED ORDER — DEXAMETHASONE SODIUM PHOSPHATE 10 MG/ML IJ SOLN
INTRAMUSCULAR | Status: DC | PRN
  Administered 2017-08-13: 10 mg via INTRAVENOUS

## 2017-08-13 MED ORDER — HYDROCODONE-ACETAMINOPHEN 5-325 MG PO TABS: 1.0000 | ORAL_TABLET | Freq: Four times a day (QID) | ORAL | 0 refills | Status: DC | PRN

## 2017-08-13 MED ORDER — SODIUM CHLORIDE 0.9 % IV SOLN
INTRAVENOUS | Status: DC | PRN
  Administered 2017-08-13: 40 mL

## 2017-08-13 MED ORDER — ACETAMINOPHEN 10 MG/ML IV SOLN
INTRAVENOUS | Status: DC | PRN
  Administered 2017-08-13: 1000 mg via INTRAVENOUS

## 2017-08-13 MED ORDER — PROPOFOL 10 MG/ML IV BOLUS
INTRAVENOUS | Status: DC | PRN
  Administered 2017-08-13: 50 mg via INTRAVENOUS
  Administered 2017-08-13: 200 mg via INTRAVENOUS

## 2017-08-13 MED ORDER — OXYCODONE HCL 5 MG PO TABS
ORAL_TABLET | ORAL | Status: AC
  Filled 2017-08-13: qty 1

## 2017-08-13 MED ORDER — SUGAMMADEX SODIUM 200 MG/2ML IV SOLN
INTRAVENOUS | Status: DC | PRN
  Administered 2017-08-13: 400 mg via INTRAVENOUS

## 2017-08-13 MED ORDER — GLYCOPYRROLATE 0.2 MG/ML IJ SOLN
INTRAMUSCULAR | Status: DC | PRN
  Administered 2017-08-13: .2 mg via INTRAVENOUS

## 2017-08-13 MED ORDER — MIDAZOLAM HCL 2 MG/2ML IJ SOLN
INTRAMUSCULAR | Status: AC
  Filled 2017-08-13: qty 2

## 2017-08-13 MED ORDER — FENTANYL CITRATE (PF) 100 MCG/2ML IJ SOLN: 25.0000 ug | INTRAMUSCULAR | Status: DC | PRN

## 2017-08-13 MED ORDER — SODIUM CHLORIDE 0.9 % IV SOLN
INTRAVENOUS | Status: DC
  Administered 2017-08-13: 09:00:00 via INTRAVENOUS

## 2017-08-13 MED ORDER — ROCURONIUM BROMIDE 100 MG/10ML IV SOLN
INTRAVENOUS | Status: DC | PRN
  Administered 2017-08-13: 10 mg via INTRAVENOUS
  Administered 2017-08-13: 50 mg via INTRAVENOUS

## 2017-08-13 MED ORDER — OXYCODONE HCL 5 MG PO TABS
5.0000 mg | ORAL_TABLET | Freq: Once | ORAL | Status: AC | PRN
  Administered 2017-08-13: 5 mg via ORAL

## 2017-08-13 SURGICAL SUPPLY — 45 items
ADH SKN CLS APL DERMABOND .7 (GAUZE/BANDAGES/DRESSINGS) ×1
BLADE SURG 15 STRL LF DISP TIS (BLADE) ×1 IMPLANT
BLADE SURG 15 STRL SS (BLADE) ×2
CANISTER SUCT 1200ML W/VALVE (MISCELLANEOUS) ×2 IMPLANT
CHLORAPREP W/TINT 26ML (MISCELLANEOUS) ×2 IMPLANT
COTTON BALL STRL MEDIUM (GAUZE/BANDAGES/DRESSINGS) IMPLANT
DERMABOND ADVANCED (GAUZE/BANDAGES/DRESSINGS) ×1
DERMABOND ADVANCED .7 DNX12 (GAUZE/BANDAGES/DRESSINGS) ×1 IMPLANT
DRAIN PENROSE 1/4X12 LTX (DRAIN) IMPLANT
DRAPE LAPAROTOMY 100X77 ABD (DRAPES) ×2 IMPLANT
DRAPE LAPAROTOMY T 102X78X121 (DRAPES) ×2 IMPLANT
DRSG TEGADERM 4X4.75 (GAUZE/BANDAGES/DRESSINGS) ×2 IMPLANT
DRSG TELFA 4X3 1S NADH ST (GAUZE/BANDAGES/DRESSINGS) ×2 IMPLANT
ELECT CAUTERY BLADE TIP 2.5 (TIP) ×2
ELECT REM PT RETURN 9FT ADLT (ELECTROSURGICAL) ×2
ELECTRODE CAUTERY BLDE TIP 2.5 (TIP) ×1 IMPLANT
ELECTRODE REM PT RTRN 9FT ADLT (ELECTROSURGICAL) ×1 IMPLANT
GLOVE BIO SURGEON STRL SZ7.5 (GLOVE) ×2 IMPLANT
GLOVE INDICATOR 8.0 STRL GRN (GLOVE) ×2 IMPLANT
GOWN STRL REUS W/ TWL LRG LVL3 (GOWN DISPOSABLE) ×2 IMPLANT
GOWN STRL REUS W/TWL LRG LVL3 (GOWN DISPOSABLE) ×4
KIT RM TURNOVER STRD PROC AR (KITS) ×2 IMPLANT
LABEL OR SOLS (LABEL) ×2 IMPLANT
MESH VENTRALEX ST 8CM LRG (Mesh General) ×1 IMPLANT
NDL HYPO 25GX1X1/2 BEV (NEEDLE) ×1 IMPLANT
NDL HYPO 25X1 1.5 SAFETY (NEEDLE) ×2 IMPLANT
NEEDLE HYPO 25GX1X1/2 BEV (NEEDLE) ×2 IMPLANT
NEEDLE HYPO 25X1 1.5 SAFETY (NEEDLE) ×4 IMPLANT
NS IRRIG 500ML POUR BTL (IV SOLUTION) ×2 IMPLANT
PACK BASIN MINOR ARMC (MISCELLANEOUS) ×2 IMPLANT
SPONGE XRAY 4X4 16PLY STRL (MISCELLANEOUS) ×1 IMPLANT
STRAP SAFETY BODY (MISCELLANEOUS) ×2 IMPLANT
SUT ETHIBOND 0 MO6 C/R (SUTURE) ×2 IMPLANT
SUT MNCRL 4-0 (SUTURE) ×2
SUT MNCRL 4-0 27XMFL (SUTURE) ×1
SUT SILK 2 0 SH (SUTURE) IMPLANT
SUT VIC AB 2-0 CT1 27 (SUTURE) ×2
SUT VIC AB 2-0 CT1 TAPERPNT 27 (SUTURE) ×1 IMPLANT
SUT VIC AB 2-0 CT2 27 (SUTURE) ×4 IMPLANT
SUT VIC AB 3-0 SH 27 (SUTURE) ×2
SUT VIC AB 3-0 SH 27X BRD (SUTURE) ×1 IMPLANT
SUTURE MNCRL 4-0 27XMF (SUTURE) ×1 IMPLANT
SYR 10ML LL (SYRINGE) ×2 IMPLANT
SYR 20CC LL (SYRINGE) ×2 IMPLANT
SYR BULB IRRIG 60ML STRL (SYRINGE) ×2 IMPLANT

## 2017-08-13 NOTE — Anesthesia Post-op Follow-up Note (Signed)
Anesthesia QCDR form completed.        

## 2017-08-13 NOTE — Anesthesia Postprocedure Evaluation (Signed)
Anesthesia Post Note  Patient: Theresa Dodson  Procedure(s) Performed: HERNIA REPAIR INCISIONAL (N/A ) HERNIA REPAIR UMBILICAL ADULT (N/A )  Patient location during evaluation: PACU Anesthesia Type: General Level of consciousness: awake and alert Pain management: pain level controlled Vital Signs Assessment: post-procedure vital signs reviewed and stable Respiratory status: spontaneous breathing, nonlabored ventilation, respiratory function stable and patient connected to nasal cannula oxygen Cardiovascular status: blood pressure returned to baseline and stable Postop Assessment: no apparent nausea or vomiting Anesthetic complications: no     Last Vitals:  Vitals:   08/13/17 1159 08/13/17 1252  BP: (!) 131/55 123/62  Pulse: 70   Resp: 14 16  Temp: (!) 36.3 C   SpO2: 95% 96%    Last Pain:  Vitals:   08/13/17 1252  TempSrc:   PainSc: 5                  Precious Haws Piscitello

## 2017-08-13 NOTE — Anesthesia Procedure Notes (Addendum)
Procedure Name: Intubation Date/Time: 08/13/2017 9:16 AM Performed by: Andria Frames, MD Pre-anesthesia Checklist: Patient identified, Patient being monitored, Timeout performed, Emergency Drugs available and Suction available Patient Re-evaluated:Patient Re-evaluated prior to induction Oxygen Delivery Method: Circle system utilized Preoxygenation: Pre-oxygenation with 100% oxygen Induction Type: IV induction Laryngoscope Size: 3 and McGraph Grade View: Grade II Tube type: Oral Tube size: 7.0 mm Number of attempts: 1 Airway Equipment and Method: Stylet Placement Confirmation: ETT inserted through vocal cords under direct vision,  positive ETCO2 and breath sounds checked- equal and bilateral Secured at: 21 cm Tube secured with: Tape Dental Injury: Teeth and Oropharynx as per pre-operative assessment  Difficulty Due To: Difficulty was anticipated, Difficult Airway- due to large tongue, Difficult Airway- due to reduced neck mobility and Difficult Airway- due to anterior larynx Future Recommendations: Recommend- induction with short-acting agent, and alternative techniques readily available

## 2017-08-13 NOTE — Op Note (Signed)
Pre-operative Diagnosis: Incisional hernia  Post-operative Diagnosis: Same  Procedure performed: Open repair of incisional hernia  Surgeon: Clayburn Pert   Assistants: None  Anesthesia: General endotracheal anesthesia  ASA Class: 2  Surgeon: Clayburn Pert, MD FACS  Anesthesia: Gen. with endotracheal tube  Assistant: None  Procedure Details  The patient was seen again in the Holding Room. The benefits, complications, treatment options, and expected outcomes were discussed with the patient. The risks of bleeding, infection, recurrence of symptoms, failure to resolve symptoms,  bowel injury, any of which could require further surgery were reviewed with the patient.   The patient was taken to Operating Room, identified as Theresa Dodson and the procedure verified.  A Time Out was held and the above information confirmed.  Prior to the induction of general anesthesia, antibiotic prophylaxis was administered. VTE prophylaxis was in place. General endotracheal anesthesia was then administered and tolerated well. After the induction, the abdomen was prepped with Chloraprep and draped in the sterile fashion. The patient was positioned in the supine position.  The obvious incisional hernia at the umbilicus was able to be reduced with anesthesia on board.  At which point an area below the umbilicus was localized with a 1% lidocaine solution.  The skin was incised with a 10 blade scalpel and using Bovie electrocautery was taken down to the dermal tissues.  At which point the hernia sac was encountered and was able to be bluntly dissected free from the superficial tissues.  Taking care to protect the hernia contents it was dissected free from the umbilical stalk with electrocautery.  Once the entire hernia sac was circumferentially freed it was able to be reduced into the abdomen.  A small area of the hernia sac was entered into without any visible bowel or abdominal contents.  The fascia was  circumferentially freed up with electrocautery.  The fascial defect measured to be approximately 4.5 cm in greatest diameter and an 8.3 cm Ventralex ST mesh was brought to the field and easily placed under fascial defect.  The fascia was then closed with interrupted 0 Ethibond suture taking care to secure the mesh to the fascial closure sutures as they were placed.  These were placed in interrupted fashion.  The sutures were then tied down closing her fascial defect with ease.  The entire operative field was copes irrigated and meticulous hemostasis was ensured.  Next using liposomal bupivacaine a field block was created.  [place] to close the incision site it was noted that an area of the umbilical stalk was injured with electrocautery and repaired with an interrupted 2-0 Vicryl suture.  The umbilical stalk was then reattached to the fascia with a 2-0 Vicryl suture.  The deep dermal tissues were then reapproximated with an interrupted 3-0 Vicryl suture.  The skin was closed with a running subcuticular Monocryl and sealed with Dermabond.  Dermabond was also placed over the area of umbilical injury.  Once the Dermabond was dry cotton balls were placed into the abdomen and secured with a Tegaderm.  Negative pressure dressing was created with the local needle and syringe.  The patient tolerated the procedure well.  She was awoken from general anesthesia and transferred to the PACU in good condition.  All counts were correct at the end the procedure and there were no immediate complications.  Findings: 4.5 cm fascial defect at the umbilicus   Estimated Blood Loss: 20 mL         Drains: None  Specimens: None          Complications: None                  Condition: Good   Clayburn Pert, MD, FACS

## 2017-08-13 NOTE — Transfer of Care (Signed)
Immediate Anesthesia Transfer of Care Note  Patient: Theresa Dodson  Procedure(s) Performed: HERNIA REPAIR INCISIONAL (N/A ) HERNIA REPAIR UMBILICAL ADULT (N/A )  Patient Location: PACU  Anesthesia Type:General  Level of Consciousness: sedated  Airway & Oxygen Therapy: Patient Spontanous Breathing and Patient connected to face mask oxygen  Post-op Assessment: Report given to RN and Post -op Vital signs reviewed and stable  Post vital signs: Reviewed and stable  Last Vitals:  Vitals:   08/13/17 0817  BP: (!) 143/67  Pulse: 79  Resp: 20  Temp: 36.6 C  SpO2: 99%    Last Pain:  Vitals:   08/13/17 0817  TempSrc: Oral         Complications: No apparent anesthesia complications

## 2017-08-13 NOTE — Interval H&P Note (Signed)
History and Physical Interval Note:  08/13/2017 8:53 AM  Theresa Dodson  has presented today for surgery, with the diagnosis of INCISIONAL HERNIA  The various methods of treatment have been discussed with the patient and family. After consideration of risks, benefits and other options for treatment, the patient has consented to  Procedure(s): HERNIA REPAIR INCISIONAL (N/A) HERNIA REPAIR UMBILICAL ADULT (N/A) as a surgical intervention .  The patient's history has been reviewed, patient examined, no change in status, stable for surgery.  I have reviewed the patient's chart and labs.  Questions were answered to the patient's satisfaction.     Clayburn Pert

## 2017-08-13 NOTE — Discharge Instructions (Signed)
AMBULATORY SURGERY  DISCHARGE INSTRUCTIONS   1) The drugs that you were given will stay in your system until tomorrow so for the next 24 hours you should not:  A) Drive an automobile B) Make any legal decisions C) Drink any alcoholic beverage   2) You may resume regular meals tomorrow.  Today it is better to start with liquids and gradually work up to solid foods.  You may eat anything you prefer, but it is better to start with liquids, then soup and crackers, and gradually work up to solid foods.   3) Please notify your doctor immediately if you have any unusual bleeding, trouble breathing, redness and pain at the surgery site, drainage, fever, or pain not relieved by medication.    4) Additional Instructions:        Please contact your physician with any problems or Same Day Surgery at [phone], Monday through Friday 6 am to 4 pm, or [place] at [facility] number at [phone].       Open Hernia Repair, Adult, Care After This sheet gives you information about how to care for yourself after your procedure. Your health care provider may also give you more specific instructions. If you have problems or questions, contact your health care provider. What can I expect after the procedure? After the procedure, it is common to have:  Mild discomfort.  Slight bruising.  Minor swelling.  Pain in the abdomen.  Follow these instructions at home: Incision care   Follow instructions from your health care provider about how to take care of your incision area. Make sure you: ? Wash your hands with soap and water before you change your bandage (dressing). If soap and water are not available, use hand sanitizer. ? Change your dressing as told by your health care provider.  Leave initial dressing in place for at least 72 hours.  After which remove the top dressing and cotton ball.  If cotton balls attached to glue trim the cotton do not peel off the glue. ? Leave  stitches (sutures), skin glue, or adhesive strips in place. These skin closures may need to stay in place for 2 weeks or longer. If adhesive strip edges start to loosen and curl up, you may trim the loose edges. Do not remove adhesive strips completely unless your health care provider tells you to do that.  Check your incision area every day for signs of infection. Check for: ? More redness, swelling, or pain. ? More fluid or blood. ? Warmth. ? Pus or a bad smell. Activity  Do not drive or use heavy machinery while taking prescription pain medicine. Do not drive until your health care provider approves.  Until your health care provider approves: ? Do not lift anything that is heavier than 10 lb (4.5 kg). ? Do not play contact sports.  Return to your normal activities as told by your health care provider. Ask your health care provider what activities are safe. General instructions  To prevent or treat constipation while you are taking prescription pain medicine, your health care provider may recommend that you: ? Drink enough fluid to keep your urine clear or pale yellow. ? Take over-the-counter or prescription medicines. ? Eat foods that are high in fiber, such as fresh fruits and vegetables, whole grains, and beans. ? Limit foods that are high in fat and processed sugars, such as fried and sweet foods.  Take over-the-counter and prescription medicines only as told by your health care provider.  Do not take tub baths or go swimming until your health care provider approves.  Okay to take a shower in 48 hours.  Keep all follow-up visits as told by your health care provider. This is important. Contact a health care provider if:  You develop a rash.  You have more redness, swelling, or pain around your incision.  You have more fluid or blood coming from your incision.  Your incision feels warm to the touch.  You have pus or a bad smell coming from your incision.  You have a fever  or chills.  You have blood in your stool (feces).  You have not had a bowel movement in 2-3 days.  Your pain is not controlled with medicine. Get help right away if:  You have chest pain or shortness of breath.  You feel light-headed or feel faint.  You have severe pain.  You vomit and your pain is worse. This information is not intended to replace advice given to you by your health care provider. Make sure you discuss any questions you have with your health care provider. Document Released: 02/22/2005 Document Revised: 02/23/2016 Document Reviewed: 01/17/2016 Elsevier Interactive Patient Education  2018 [employer].

## 2017-08-13 NOTE — Anesthesia Preprocedure Evaluation (Signed)
Anesthesia Evaluation  Patient identified by MRN, date of birth, ID band Patient awake    Reviewed: Allergy & Precautions, H&P , NPO status , Patient's Chart, lab work & pertinent test results  History of Anesthesia Complications Negative for: history of anesthetic complications  Airway Mallampati: III  TM Distance: <3 FB Neck ROM: full    Dental  (+) Chipped, Poor Dentition   Pulmonary neg shortness of breath, sleep apnea and Continuous Positive Airway Pressure Ventilation ,           Cardiovascular Exercise Tolerance: Good hypertension, (-) angina(-) Past MI and (-) DOE      Neuro/Psych PSYCHIATRIC DISORDERS  Neuromuscular disease    GI/Hepatic Neg liver ROS, GERD  Medicated and Controlled,  Endo/Other  Hypothyroidism   Renal/GU Renal disease     Musculoskeletal  (+) Arthritis ,   Abdominal   Peds  Hematology negative hematology ROS (+)   Anesthesia Other Findings Past Medical History: No date: Cancer ([place])     Comment:  skin ca No date: Chronic renal insufficiency No date: Diverticulitis No date: Generalized osteoarthritis No date: GERD (gastroesophageal reflux disease) No date: Gout No date: Gout No date: Hypercholesteremia No date: Hypertension No date: Pre-diabetes No date: Sleep apnea  Past Surgical History: 2012: breast benign mass removal 07/2011: BREAST BIOPSY; Right     Comment:  NEG No date: CARPAL TUNNEL RELEASE; Bilateral No date: CHOLECYSTECTOMY 06/26/2017: COLONOSCOPY WITH PROPOFOL; N/A     Comment:  Procedure: COLONOSCOPY WITH PROPOFOL;  Surgeon:               Lollie Sails, MD;  Location: [facility] ENDOSCOPY;                Service: Endoscopy;  Laterality: N/A; No date: FOOT SURGERY     Comment:  d/t gout No date: MANDIBLE SURGERY No date: tooth abscess No date: WRIST SURGERY  BMI    Body Mass Index:  37.91 kg/m      Reproductive/Obstetrics negative OB ROS                              Anesthesia Physical Anesthesia Plan  ASA: III  Anesthesia Plan: General   Post-op Pain Management:    Induction: Intravenous  PONV Risk Score and Plan: 2 and Ondansetron, Midazolam and Dexamethasone  Airway Management Planned: LMA  Additional Equipment:   Intra-op Plan:   Post-operative Plan: Extubation in OR  Informed Consent: I have reviewed the patients History and Physical, chart, labs and discussed the procedure including the risks, benefits and alternatives for the proposed anesthesia with the patient or authorized representative who has indicated his/her understanding and acceptance.   Dental Advisory Given  Plan Discussed with: Anesthesiologist, CRNA and Surgeon  Anesthesia Plan Comments: (Patient consented for risks of anesthesia including but not limited to:  - adverse reactions to medications - damage to teeth, lips or other oral mucosa - sore throat or hoarseness - Damage to heart, brain, lungs or loss of life  Patient voiced understanding.)        Anesthesia Quick Evaluation

## 2017-08-13 NOTE — Brief Op Note (Signed)
08/13/2017  11:02 AM  PATIENT:  Theresa Dodson  71 y.o. female  PRE-OPERATIVE DIAGNOSIS:  INCISIONAL HERNIA  POST-OPERATIVE DIAGNOSIS:  INCISIONAL HERNIA  PROCEDURE:  Procedure(s): HERNIA REPAIR INCISIONAL (N/A) HERNIA REPAIR UMBILICAL ADULT (N/A)  SURGEON:  Surgeon(s) and Role:    * Clayburn Pert, MD - Primary  PHYSICIAN ASSISTANT:   ASSISTANTS: none   ANESTHESIA:   general  EBL:  20 mL   BLOOD ADMINISTERED:none  DRAINS: none   LOCAL MEDICATIONS USED:  OTHER liposomal bupivacaine  SPECIMEN:  No Specimen  DISPOSITION OF SPECIMEN:  N/A  COUNTS:  YES  TOURNIQUET:  * No tourniquets in log *  DICTATION: .Dragon Dictation  PLAN OF CARE: Discharge to home after PACU  PATIENT DISPOSITION:  PACU - hemodynamically stable.   Delay start of Pharmacological VTE agent (>24hrs) due to surgical blood loss or risk of bleeding: no

## 2017-08-14 ENCOUNTER — Encounter: Payer: Self-pay | Admitting: General Surgery

## 2017-08-20 ENCOUNTER — Telehealth: Payer: Self-pay

## 2017-08-20 NOTE — Telephone Encounter (Signed)
Patient called at this time. She said when she removed the cotton ball from her navel there was a steri strip attached to it. She wanted to know if this was ok and was told yes. She is doing well at this time. Discussed post op 08/28/17.

## 2017-08-28 ENCOUNTER — Ambulatory Visit (INDEPENDENT_AMBULATORY_CARE_PROVIDER_SITE_OTHER): Payer: Medicare Other | Admitting: General Surgery

## 2017-08-28 ENCOUNTER — Encounter: Payer: Self-pay | Admitting: General Surgery

## 2017-08-28 VITALS — BP 137/79 | HR 76 | Temp 98.2°F | Ht 63.0 in | Wt 213.0 lb

## 2017-08-28 DIAGNOSIS — Z4889 Encounter for other specified surgical aftercare: Secondary | ICD-10-CM

## 2017-08-28 NOTE — Progress Notes (Signed)
Outpatient Surgical Follow Up  08/28/2017  Theresa Dodson is an 72 y.o. female.   Chief Complaint  Patient presents with  . Routine Post Op    post op--Open repair of incisional hernia-08/13/17-Theresa Dodson    HPI: 72 year old female returns to clinic now 2 weeks status post open incisional hernia repair.  Patient reports doing very well.  She denies any pain.  She is eating well and having normal bowel function.  She denies any fevers, chills, nausea, vomiting, chest pain, shortness of breath, diarrhea, constipation.  Patient reports she has not required a pain pill in more than a week.  Past Medical History:  Diagnosis Date  . Cancer ([place])    skin ca  . Chronic renal insufficiency   . Difficult intubation   . Diverticulitis   . Generalized osteoarthritis   . GERD (gastroesophageal reflux disease)   . Gout   . Gout   . Hypercholesteremia   . Hypertension   . Pre-diabetes   . Sleep apnea     Past Surgical History:  Procedure Laterality Date  . breast benign mass removal  2012  . BREAST BIOPSY Right 07/2011   NEG  . CARPAL TUNNEL RELEASE Bilateral   . CHOLECYSTECTOMY    . COLONOSCOPY WITH PROPOFOL N/A 06/26/2017   Procedure: COLONOSCOPY WITH PROPOFOL;  Surgeon: Lollie Sails, MD;  Location: [facility] ENDOSCOPY;  Service: Endoscopy;  Laterality: N/A;  . FOOT SURGERY     d/t gout  . INCISIONAL HERNIA REPAIR N/A 08/13/2017   Procedure: HERNIA REPAIR INCISIONAL;  Surgeon: Clayburn Pert, MD;  Location: [facility] ORS;  Service: General;  Laterality: N/A;  . MANDIBLE SURGERY    . tooth abscess    . UMBILICAL HERNIA REPAIR N/A 08/13/2017   Procedure: HERNIA REPAIR UMBILICAL ADULT;  Surgeon: Clayburn Pert, MD;  Location: [facility] ORS;  Service: General;  Laterality: N/A;  . WRIST SURGERY      Family History  Problem Relation Age of Onset  . Ovarian cancer Mother   . Stroke Father   . Hypertension Sister   . Heart disease Maternal Grandmother   . Heart disease Maternal Grandfather    . Heart disease Paternal Grandmother   . Diabetes Paternal Grandfather   . Breast cancer Neg Hx     Social History:  reports that  has never smoked. she has never used smokeless tobacco. She reports that she does not drink alcohol or use drugs.  Allergies:  Allergies  Allergen Reactions  . Codeine Nausea And Vomiting    Medications reviewed.    ROS A multipoint review of systems was completed, all pertinent positives and negatives are documented within the HPI and the remainder are negative.   BP 137/79   Pulse 76   Temp 98.2 F (36.8 C) (Oral)   Ht 5\' 3"  (1.6 m)   Wt 96.6 kg (213 lb)   BMI 37.73 kg/m   Physical Exam General: No acute distress Chest: Clear to auscultation  heart: Regular rate and rhythm Abdomen: Soft, nontender, nondistended.  Well approximated infraumbilical incision site with Dermabond still in place.  Evidence of Dermabond and cotton ball within the umbilical stalk that is brown in color.  No evidence of spreading erythema or purulence.    No results found for this or any previous visit (from the past 48 hour(s)). No results found.  Assessment/Plan:  1. Aftercare following surgery 72 year old female status post open incisional hernia repair at the site of the umbilicus.  Doing very well.  Discussed the signs and symptoms of infection or recurrence and to return to clinic immediately should they occur.  Otherwise provided with standard postoperative precautions and instructions.  She will follow-up in clinic on as-needed basis.     Clayburn Pert, MD FACS General Surgeon  08/28/2017,4:24 PM

## 2017-08-28 NOTE — Patient Instructions (Addendum)
GENERAL POST-OPERATIVE PATIENT INSTRUCTIONS   WOUND CARE INSTRUCTIONS:  Keep a dry clean dressing on the wound if there is drainage. The initial bandage may be removed after 24 hours.  Once the wound has quit draining you may leave it open to air.  If clothing rubs against the wound or causes irritation and the wound is not draining you may cover it with a dry dressing during the daytime.  Try to keep the wound dry and avoid ointments on the wound unless directed to do so.  If the wound becomes bright red and painful or starts to drain infected material that is not clear, please contact your physician immediately.  If the wound is mildly pink and has a thick firm ridge underneath it, this is normal, and is referred to as a healing ridge.  This will resolve over the next 4-6 weeks.  BATHING: You may shower if you have been informed of this by your surgeon. However, Please do not submerge in a tub, hot tub, or pool until incisions are completely sealed or have been told by your surgeon that you may do so. You may submerge around 09/11/2017  DIET:  You may eat any foods that you can tolerate.  It is a good idea to eat a high fiber diet and take in plenty of fluids to prevent constipation.  If you do become constipated you may want to take a mild laxative or take ducolax tablets on a daily basis until your bowel habits are regular.  Constipation can be very uncomfortable, along with straining, after recent surgery.  ACTIVITY:  You are encouraged to cough and deep breath or use your incentive spirometer if you were given one, every 15-30 minutes when awake.  This will help prevent respiratory complications and low grade fevers post-operatively if you had a general anesthetic.  You may want to hug a pillow when coughing and sneezing to add additional support to the surgical area, if you had abdominal or chest surgery, which will decrease pain during these times.  You are encouraged to walk and engage in light  activity for the next two weeks.  You should not lift more than 20 pounds, until 10/04/2017 as it could put you at increased risk for complications.  Twenty pounds is roughly equivalent to a plastic bag of groceries. At that time- Listen to your body when lifting, if you have pain when lifting, stop and then try again in a few days. Soreness after doing exercises or activities of daily living is normal as you get back in to your normal routine. In order for you to begin driving you must pass the "driving test"---No narcotics for 48 hrs, Be able to wear seatbelt comfortably, and Be able to check your blindspots.   MEDICATIONS:  Try to take narcotic medications and anti-inflammatory medications, such as tylenol, ibuprofen, naprosyn, etc., with food.  This will minimize stomach upset from the medication.  Should you develop nausea and vomiting from the pain medication, or develop a rash, please discontinue the medication and contact your physician.  You should not drive, make important decisions, or operate machinery when taking narcotic pain medication.  SUNBLOCK Use sun block to incision area over the next year if this area will be exposed to sun. This helps decrease scarring and will allow you avoid a permanent darkened area over your incision.  QUESTIONS:  Please feel free to call our office if you have any questions, and we will be glad to assist you.  (  [phone]   We have sent a referral over to ENT for you to see Dr. Tami Ribas. If you do not hear from them within the next 24-48 hours please give our office a call.

## 2017-11-13 ENCOUNTER — Other Ambulatory Visit: Payer: Self-pay | Admitting: Family Medicine

## 2017-11-13 DIAGNOSIS — Z780 Asymptomatic menopausal state: Secondary | ICD-10-CM

## 2017-11-13 DIAGNOSIS — Z1239 Encounter for other screening for malignant neoplasm of breast: Secondary | ICD-10-CM

## 2017-11-25 ENCOUNTER — Encounter (INDEPENDENT_AMBULATORY_CARE_PROVIDER_SITE_OTHER): Payer: Self-pay

## 2017-11-25 ENCOUNTER — Ambulatory Visit
Admission: RE | Admit: 2017-11-25 | Discharge: 2017-11-25 | Disposition: A | Discharge status: Home / Self Care | Payer: Medicare Other | Source: Ambulatory Visit | Attending: Family Medicine | Admitting: Family Medicine

## 2017-11-25 DIAGNOSIS — Z1239 Encounter for other screening for malignant neoplasm of breast: Secondary | ICD-10-CM

## 2017-11-25 DIAGNOSIS — Z780 Asymptomatic menopausal state: Secondary | ICD-10-CM | POA: Insufficient documentation

## 2017-11-25 DIAGNOSIS — Z1231 Encounter for screening mammogram for malignant neoplasm of breast: Secondary | ICD-10-CM | POA: Insufficient documentation

## 2017-11-25 DIAGNOSIS — I10 Essential (primary) hypertension: Secondary | ICD-10-CM | POA: Diagnosis not present

## 2018-04-11 ENCOUNTER — Other Ambulatory Visit: Payer: Self-pay

## 2018-04-11 ENCOUNTER — Ambulatory Visit
Admission: EM | Admit: 2018-04-11 | Discharge: 2018-04-11 | Disposition: A | Discharge status: Home / Self Care | Payer: Medicare Other | Attending: Family Medicine | Admitting: Family Medicine

## 2018-04-11 ENCOUNTER — Encounter: Payer: Self-pay | Admitting: Emergency Medicine

## 2018-04-11 ENCOUNTER — Other Ambulatory Visit
Admission: RE | Admit: 2018-04-11 | Discharge: 2018-04-11 | Disposition: A | Discharge status: Home / Self Care | Payer: Medicare Other | Source: Ambulatory Visit | Attending: Family Medicine | Admitting: Family Medicine

## 2018-04-11 DIAGNOSIS — Z7951 Long term (current) use of inhaled steroids: Secondary | ICD-10-CM | POA: Diagnosis not present

## 2018-04-11 DIAGNOSIS — E7800 Pure hypercholesterolemia, unspecified: Secondary | ICD-10-CM | POA: Insufficient documentation

## 2018-04-11 DIAGNOSIS — K219 Gastro-esophageal reflux disease without esophagitis: Secondary | ICD-10-CM | POA: Insufficient documentation

## 2018-04-11 DIAGNOSIS — R7303 Prediabetes: Secondary | ICD-10-CM | POA: Insufficient documentation

## 2018-04-11 DIAGNOSIS — R197 Diarrhea, unspecified: Secondary | ICD-10-CM | POA: Diagnosis not present

## 2018-04-11 DIAGNOSIS — M109 Gout, unspecified: Secondary | ICD-10-CM | POA: Insufficient documentation

## 2018-04-11 DIAGNOSIS — I10 Essential (primary) hypertension: Secondary | ICD-10-CM | POA: Insufficient documentation

## 2018-04-11 DIAGNOSIS — R109 Unspecified abdominal pain: Secondary | ICD-10-CM | POA: Insufficient documentation

## 2018-04-11 DIAGNOSIS — Z85828 Personal history of other malignant neoplasm of skin: Secondary | ICD-10-CM | POA: Insufficient documentation

## 2018-04-11 LAB — GASTROINTESTINAL PANEL BY PCR, STOOL (REPLACES STOOL CULTURE)
Adenovirus F40/41: NOT DETECTED
Astrovirus: NOT DETECTED
CAMPYLOBACTER SPECIES: NOT DETECTED
CYCLOSPORA CAYETANENSIS: NOT DETECTED
Cryptosporidium: NOT DETECTED
ENTAMOEBA HISTOLYTICA: NOT DETECTED
ENTEROTOXIGENIC E COLI (ETEC): NOT DETECTED
Enteroaggregative E coli (EAEC): NOT DETECTED
Enteropathogenic E coli (EPEC): NOT DETECTED
Giardia lamblia: NOT DETECTED
NOROVIRUS GI/GII: NOT DETECTED
PLESIMONAS SHIGELLOIDES: NOT DETECTED
Rotavirus A: NOT DETECTED
SALMONELLA SPECIES: NOT DETECTED
SAPOVIRUS (I, II, IV, AND V): NOT DETECTED
SHIGA LIKE TOXIN PRODUCING E COLI (STEC): NOT DETECTED
Shigella/Enteroinvasive E coli (EIEC): NOT DETECTED
VIBRIO CHOLERAE: NOT DETECTED
Vibrio species: NOT DETECTED
Yersinia enterocolitica: NOT DETECTED

## 2018-04-11 LAB — COMPREHENSIVE METABOLIC PANEL
ALT: 64 U/L — AB (ref 0–44)
AST: 62 U/L — AB (ref 15–41)
Albumin: 3.6 g/dL (ref 3.5–5.0)
Alkaline Phosphatase: 162 U/L — ABNORMAL HIGH (ref 38–126)
Anion gap: 10 (ref 5–15)
BUN: 18 mg/dL (ref 8–23)
CHLORIDE: 103 mmol/L (ref 98–111)
CO2: 24 mmol/L (ref 22–32)
CREATININE: 1.06 mg/dL — AB (ref 0.44–1.00)
Calcium: 9.2 mg/dL (ref 8.9–10.3)
GFR calc Af Amer: 59 mL/min — ABNORMAL LOW (ref >60)
GFR calc non Af Amer: 51 mL/min — ABNORMAL LOW (ref >60)
Glucose, Bld: 123 mg/dL — ABNORMAL HIGH (ref 70–99)
Potassium: 4.4 mmol/L (ref 3.5–5.1)
Sodium: 137 mmol/L (ref 135–145)
Total Bilirubin: 0.4 mg/dL (ref 0.3–1.2)
Total Protein: 7 g/dL (ref 6.5–8.1)

## 2018-04-11 LAB — C DIFFICILE QUICK SCREEN W PCR REFLEX
C DIFFICILE (CDIFF) TOXIN: NEGATIVE
C Diff antigen: NEGATIVE
C Diff interpretation: NOT DETECTED

## 2018-04-11 NOTE — ED Triage Notes (Signed)
Patient in today c/o diarrhea after she ate on Sunday (04/05/18) at [place]. Patient states that her niece also had diarrhea x 2 days after eating at the same place. Patient has continued to have diarrhea. Patient has been using Imodium AD.

## 2018-04-11 NOTE — ED Provider Notes (Signed)
[facility]  CSN: [id number] Arrival date & time: 04/11/18  0801  History   Chief Complaint Chief Complaint  Patient presents with  . Diarrhea   HPI  72 year old female presents with diarrhea.  Patient states that her diarrhea started on Sunday after eating at a local restaurant.  She states that her niece also had diarrhea after eating at this restaurant.  She reports some crampy lower abdominal pain.  She has taken a few doses of Imodium without significant improvement.  Diarrhea is watery.  No fever chills.  No known exacerbating factors.  No other associated symptoms.  No other complaints.  Of note, patient recently had a recent course of antibiotic (Augmentin for Sinusitis).  PMH, Surgical Hx, Family Hx, Social History reviewed and updated as below.  Past Medical History:  Diagnosis Date  . Cancer ([place])    skin ca  . Chronic renal insufficiency   . Difficult intubation   . Diverticulitis   . Generalized osteoarthritis   . GERD (gastroesophageal reflux disease)   . Gout   . Gout   . Hypercholesteremia   . Hypertension   . Pre-diabetes   . Sleep apnea     Past Surgical History:  Procedure Laterality Date  . breast benign mass removal  2012  . BREAST BIOPSY Right 07/2011   NEG  . CARPAL TUNNEL RELEASE Bilateral   . CATARACT EXTRACTION Right   . CHOLECYSTECTOMY    . COLONOSCOPY WITH PROPOFOL N/A 06/26/2017   Procedure: COLONOSCOPY WITH PROPOFOL;  Surgeon: Lollie Sails, MD;  Location: [facility]COPY;  Service: Endoscopy;  Laterality: N/A;  . FOOT SURGERY     d/t gout  . INCISIONAL HERNIA REPAIR N/A 08/13/2017   Procedure: HERNIA REPAIR INCISIONAL;  Surgeon: Clayburn Pert, MD;  Location: [facility] ORS;  Service: General;  Laterality: N/A;  . MANDIBLE SURGERY    . tooth abscess    . UMBILICAL HERNIA REPAIR N/A 08/13/2017   Procedure: HERNIA REPAIR UMBILICAL ADULT;  Surgeon: Clayburn Pert, MD;  Location: [facility] ORS;  Service: General;  Laterality:  N/A;  . WRIST SURGERY      Family History  Problem Relation Age of Onset  . Ovarian cancer Mother   . Stroke Father   . Hypertension Sister   . Heart disease Maternal Grandmother   . Heart disease Maternal Grandfather   . Heart disease Paternal Grandmother   . Diabetes Paternal Grandfather   . Breast cancer Neg Hx     Social History   Tobacco Use  . Smoking status: Never Smoker  . Smokeless tobacco: Never Used  Substance Use Topics  . Alcohol use: No    Home Medications    Prior to Admission medications   Medication Sig Start Date End Date Taking? Authorizing Provider  acetaminophen (TYLENOL) 650 MG CR tablet Take 650 mg by mouth 2 (two) times daily.    Yes [provider]  allopurinol (ZYLOPRIM) 100 MG tablet Take 100 mg by mouth at bedtime.    Yes [provider]  allopurinol (ZYLOPRIM) 300 MG tablet Take 300 mg by mouth daily.   Yes [provider]  atorvastatin (LIPITOR) 10 MG tablet Take 10 mg by mouth at bedtime.    Yes [provider]  cyanocobalamin 1000 MCG tablet Take 1,000 mcg by mouth daily.    Yes [provider]  cyclobenzaprine (FLEXERIL) 10 MG tablet Take 10 mg by mouth 3 (three) times daily as needed for muscle spasms.   Yes [provider]  docusate sodium (COLACE) 100 MG capsule Take 100 mg by mouth daily.   Yes [provider]  famotidine (PEPCID) 20 MG tablet Take 20 mg by mouth 2 (two) times daily as needed for heartburn.    Yes [provider]  fluticasone (FLONASE) 50 MCG/ACT nasal spray Place 1 spray into both nostrils 2 (two) times daily as needed for allergies or rhinitis (congestion).   Yes [provider]  gabapentin (NEURONTIN) 600 MG tablet Take 600 mg by mouth 2 (two) times daily.    Yes [provider]  guaiFENesin (MUCINEX) 600 MG 12 hr tablet Take 600 mg by mouth daily as needed for cough or to loosen phlegm (allergies).   Yes [provider]    lisinopril (PRINIVIL,ZESTRIL) 20 MG tablet  08/11/17  Yes [provider]  omega-3 acid ethyl esters (LOVAZA) 1 G capsule Take 2 g by mouth 2 (two) times daily.    Yes [provider]  Probiotic Product (PROBIOTIC PO) Take 1 tablet by mouth daily.   Yes [provider]  aspirin EC 81 MG tablet Take 81 mg by mouth at bedtime.     [provider]  colchicine 0.6 MG tablet Take 0.6 mg by mouth daily as needed (pain).     [provider]  hydrochlorothiazide (HYDRODIURIL) 25 MG tablet Take 25 mg by mouth daily as needed (fluid retention).  05/12/17 05/13/18  [provider]    Family History Family History  Problem Relation Age of Onset  . Ovarian cancer Mother   . Stroke Father   . Hypertension Sister   . Heart disease Maternal Grandmother   . Heart disease Maternal Grandfather   . Heart disease Paternal Grandmother   . Diabetes Paternal Grandfather   . Breast cancer Neg Hx     Social History Social History   Tobacco Use  . Smoking status: Never Smoker  . Smokeless tobacco: Never Used  Substance Use Topics  . Alcohol use: No  . Drug use: No     Allergies   Codeine   Review of Systems Review of Systems  Constitutional: Negative.   Gastrointestinal: Positive for abdominal pain and diarrhea.   Physical Exam Triage Vital Signs ED Triage Vitals  Enc Vitals Group     BP 04/11/18 0812 135/64     Pulse Rate 04/11/18 0812 76     Resp 04/11/18 0812 16     Temp 04/11/18 0812 98.1 F (36.7 C)     Temp Source 04/11/18 0812 Oral     SpO2 04/11/18 0812 97 %     Weight 04/11/18 0811 205 lb (93 kg)     Height 04/11/18 0811 '5\' 3"'  (1.6 m)     Head Circumference --      Peak Flow --      Pain Score 04/11/18 0811 2     Pain Loc --      Pain Edu? --      Excl. in [place]? --    Updated Vital Signs BP 135/64 (BP Location: Left Arm)   Pulse 76   Temp 98.1 F (36.7 C) (Oral)   Resp 16   Ht '5\' 3"'  (1.6 m)   Wt 93 kg   SpO2 97%    BMI 36.31 kg/m   Visual Acuity Right Eye Distance:   Left Eye Distance:   Bilateral Distance:    Right Eye Near:   Left Eye Near:    Bilateral Near:  Physical Exam  Constitutional: She is oriented to person, place, and time. She appears well-developed. No distress.  Cardiovascular: Normal rate and regular rhythm.  Pulmonary/Chest: Effort normal and breath sounds normal. She has no wheezes. She has no rales.  Abdominal: Soft. She exhibits no distension. There is no tenderness.  Neurological: She is alert and oriented to person, place, and time.  Psychiatric: She has a normal mood and affect. Her behavior is normal.  Nursing note and vitals reviewed.  UC Treatments / Results  Labs (all labs ordered are listed, but only abnormal results are displayed) Labs Reviewed  COMPREHENSIVE METABOLIC PANEL - Abnormal; Notable for the following components:      Result Value   Glucose, Bld 123 (*)    Creatinine, Ser 1.06 (*)    AST 62 (*)    ALT 64 (*)    Alkaline Phosphatase 162 (*)    GFR calc non Af Amer 51 (*)    GFR calc Af Amer 59 (*)    All other components within normal limits    EKG None  Radiology No results found.  Procedures Procedures (including critical care time)  Medications Ordered in UC Medications - No data to display  Initial Impression / Assessment and Plan / UC Course  I have reviewed the triage vital signs and the nursing notes.  Pertinent labs & imaging results that were available during my care of the patient were reviewed by me and considered in my medical decision making (see chart for details).    72 year old female presents with diarrhea.  Uncertain etiology at this time but is presumed to be infectious.  Possible C. difficile.  Sending home with a stool kit.  Advised against Imodium.  Metabolic panel obtained today to assess for acute kidney injury and electrolytes disturbance and was unremarkable.  Awaiting results.  Supportive care.  Final  Clinical Impressions(s) / UC Diagnoses   Final diagnoses:  Diarrhea of presumed infectious origin     Discharge Instructions     Return with stool.  No imodium.  Take care  Dr. Lacinda Axon     ED Prescriptions    None     Controlled Substance Prescriptions [place] Controlled Substance Registry consulted? Not Applicable   Coral Spikes, [place] 04/11/18 2130

## 2018-04-11 NOTE — Discharge Instructions (Signed)
Return with stool.  No imodium.  Take care  Dr. Lacinda Axon

## 2018-04-12 ENCOUNTER — Telehealth: Payer: Self-pay | Admitting: Family Medicine

## 2018-04-12 MED ORDER — DIPHENOXYLATE-ATROPINE 2.5-0.025 MG PO TABS: 1.0000 | ORAL_TABLET | Freq: Four times a day (QID) | ORAL | 0 refills | Status: DC | PRN

## 2018-04-12 NOTE — Progress Notes (Signed)
Relayed results to patient per dr. Jonathon Jordan request.

## 2018-04-12 NOTE — Telephone Encounter (Signed)
C diff & G panel negative. Sending in Lomotil.

## 2018-12-29 ENCOUNTER — Other Ambulatory Visit: Payer: Self-pay

## 2018-12-29 ENCOUNTER — Ambulatory Visit
Admission: EM | Admit: 2018-12-29 | Discharge: 2018-12-29 | Disposition: A | Discharge status: Home / Self Care | Payer: Medicare Other | Attending: Family Medicine | Admitting: Family Medicine

## 2018-12-29 DIAGNOSIS — Z23 Encounter for immunization: Secondary | ICD-10-CM

## 2018-12-29 DIAGNOSIS — W260XXA Contact with knife, initial encounter: Secondary | ICD-10-CM

## 2018-12-29 DIAGNOSIS — S61012A Laceration without foreign body of left thumb without damage to nail, initial encounter: Secondary | ICD-10-CM

## 2018-12-29 MED ORDER — TETANUS-DIPHTHERIA TOXOIDS TD 5-2 LFU IM INJ
0.5000 mL | INJECTION | Freq: Once | INTRAMUSCULAR | Status: AC
  Administered 2018-12-29: 0.5 mL via INTRAMUSCULAR

## 2018-12-29 NOTE — ED Triage Notes (Signed)
Patient complains of laceration to her left thumb, occurred while cutting zucchini. Patient states that it was with a knife around 25 mins ago. Patient states that her tetanus is up to date.

## 2018-12-29 NOTE — Discharge Instructions (Signed)
Keep clean.  Sutures out in 7 days. You may make an appt if you like.  Dressing off late tonight or tomorrow.  Take care  Dr. Lacinda Axon

## 2018-12-29 NOTE — ED Provider Notes (Signed)
[facility]    CSN: [id number] Arrival date & time: 12/29/18  1644  History   Chief Complaint Chief Complaint  Patient presents with  . Laceration   HPI  73 year old female presents with a laceration to her left thumb.  Patient suffered a laceration at approximately 4:35 PM.  She was cutting zucchini and accidentally cut her left thumb with a knife.  Suffered a laceration to the dorsum of the thumb below the IP joint.  Could not control the bleeding at home and thus presented for evaluation.  Unsure of last tetanus.  Minimal pain at this time.  She states that she cleaned the wound.  No other interventions tried.  No other associated symptoms.  No other complaints.  PMH, Surgical Hx, Family Hx, Social History reviewed and updated as below.  Past Medical History:  Diagnosis Date  . Cancer ([place])    skin ca  . Chronic renal insufficiency   . Difficult intubation   . Diverticulitis   . Generalized osteoarthritis   . GERD (gastroesophageal reflux disease)   . Gout   . Gout   . Hypercholesteremia   . Hypertension   . Pre-diabetes   . Sleep apnea     Patient Active Problem List   Diagnosis Date Noted  . Incisional hernia, without obstruction or gangrene   . Type 2 diabetes mellitus without complication, without long-term current use of insulin ([place]) 08/05/2017  . Allergic rhinitis 07/29/2017  . Back pain 07/29/2017  . Generalized osteoarthrosis, unspecified site 07/29/2017  . Hx of diverticulitis of colon 07/29/2017  . Hyperlipidemia, unspecified 07/29/2017  . CKD (chronic kidney disease) stage 3, GFR 30-59 ml/min ([facility]) 06/19/2014  . Hyperglycemia, unspecified 06/19/2014  . OSA on CPAP 05/07/2013  . SCIATICA, RIGHT 05/14/2010  . Pure hypercholesterolemia 04/05/2010  . KNEE PAIN, RIGHT 02/26/2010  . BACK PAIN, LUMBAR, WITH RADICULOPATHY 02/09/2010  . GOUT 11/17/2009  . MOURNING 03/28/2009  . SKIN LESION 03/28/2009  . OTHER SPECIFIED DISORDER OF SKIN  02/14/2009  . UNSPECIFIED HYPOTHYROIDISM 12/20/2008  . ONYCHOMYCOSIS, BILATERAL 11/15/2008  . HYPERTENSION 07/05/2008  . GERD 07/05/2008    Past Surgical History:  Procedure Laterality Date  . breast benign mass removal  2012  . BREAST BIOPSY Right 07/2011   NEG  . CARPAL TUNNEL RELEASE Bilateral   . CATARACT EXTRACTION Right   . CHOLECYSTECTOMY    . COLONOSCOPY WITH PROPOFOL N/A 06/26/2017   Procedure: COLONOSCOPY WITH PROPOFOL;  Surgeon: Lollie Sails, MD;  Location: [facility] ENDOSCOPY;  Service: Endoscopy;  Laterality: N/A;  . FOOT SURGERY     d/t gout  . INCISIONAL HERNIA REPAIR N/A 08/13/2017   Procedure: HERNIA REPAIR INCISIONAL;  Surgeon: Clayburn Pert, MD;  Location: [facility] ORS;  Service: General;  Laterality: N/A;  . MANDIBLE SURGERY    . tooth abscess    . UMBILICAL HERNIA REPAIR N/A 08/13/2017   Procedure: HERNIA REPAIR UMBILICAL ADULT;  Surgeon: Clayburn Pert, MD;  Location: [facility] ORS;  Service: General;  Laterality: N/A;  . WRIST SURGERY      OB History   No obstetric history on file.      Home Medications    Prior to Admission medications   Medication Sig Start Date End Date Taking? Authorizing Provider  acetaminophen (TYLENOL) 650 MG CR tablet Take 650 mg by mouth 2 (two) times daily.    Yes [provider]  allopurinol (ZYLOPRIM) 100 MG tablet Take 100 mg by mouth at bedtime.    Yes [provider]  allopurinol (ZYLOPRIM) 300 MG tablet Take 300 mg by mouth daily.   Yes [provider]  aspirin EC 81 MG tablet Take 81 mg by mouth at bedtime.    Yes [provider]  atorvastatin (LIPITOR) 10 MG tablet Take 10 mg by mouth at bedtime.    Yes [provider]  colchicine 0.6 MG tablet Take 0.6 mg by mouth daily as needed (pain).    Yes [provider]  cyanocobalamin 1000 MCG tablet Take 1,000 mcg by mouth daily.    Yes [provider]  cyclobenzaprine (FLEXERIL) 10 MG tablet Take 10 mg by mouth 3  (three) times daily as needed for muscle spasms.   Yes [provider]  diphenoxylate-atropine (LOMOTIL) 2.5-0.025 MG tablet Take 1 tablet by mouth 4 (four) times daily as needed for diarrhea or loose stools. 04/12/18  Yes Indie Nickerson G, DO  docusate sodium (COLACE) 100 MG capsule Take 100 mg by mouth daily.   Yes [provider]  famotidine (PEPCID) 20 MG tablet Take 20 mg by mouth 2 (two) times daily as needed for heartburn.    Yes [provider]  fluticasone (FLONASE) 50 MCG/ACT nasal spray Place 1 spray into both nostrils 2 (two) times daily as needed for allergies or rhinitis (congestion).   Yes [provider]  guaiFENesin (MUCINEX) 600 MG 12 hr tablet Take 600 mg by mouth daily as needed for cough or to loosen phlegm (allergies).   Yes [provider]  lisinopril (PRINIVIL,ZESTRIL) 20 MG tablet  08/11/17  Yes [provider]  omega-3 acid ethyl esters (LOVAZA) 1 G capsule Take 2 g by mouth 2 (two) times daily.    Yes [provider]  Probiotic Product (PROBIOTIC PO) Take 1 tablet by mouth daily.   Yes [provider]    Family History Family History  Problem Relation Age of Onset  . Ovarian cancer Mother   . Stroke Father   . Hypertension Sister   . Heart disease Maternal Grandmother   . Heart disease Maternal Grandfather   . Heart disease Paternal Grandmother   . Diabetes Paternal Grandfather   . Breast cancer Neg Hx     Social History Social History   Tobacco Use  . Smoking status: Never Smoker  . Smokeless tobacco: Never Used  Substance Use Topics  . Alcohol use: No  . Drug use: No     Allergies   Codeine   Review of Systems Review of Systems  Constitutional: Negative.   Skin: Positive for wound.   Physical Exam Triage Vital Signs ED Triage Vitals  Enc Vitals Group     BP 12/29/18 1658 (!) 159/85     Pulse Rate 12/29/18 1658 74     Resp 12/29/18 1658 16     Temp 12/29/18 1658 98.2 F  (36.8 C)     Temp Source 12/29/18 1658 Oral     SpO2 12/29/18 1658 99 %     Weight 12/29/18 1655 203 lb (92.1 kg)     Height 12/29/18 1655 5\' 2"  (1.575 m)     Head Circumference --      Peak Flow --      Pain Score 12/29/18 1655 2     Pain Loc --      Pain Edu? --      Excl. in [place]? --    Updated Vital Signs BP (!) 159/85 (BP Location: Left Arm)   Pulse 74   Temp  98.2 F (36.8 C) (Oral)   Resp 16   Ht 5\' 2"  (1.575 m)   Wt 92.1 kg   SpO2 99%   BMI 37.13 kg/m   Visual Acuity Right Eye Distance:   Left Eye Distance:   Bilateral Distance:    Right Eye Near:   Left Eye Near:    Bilateral Near:     Physical Exam Vitals signs and nursing note reviewed.  Constitutional:      General: She is not in acute distress.    Appearance: Normal appearance. She is obese.  HENT:     Head: Normocephalic and atraumatic.  Eyes:     General:        Right eye: No discharge.        Left eye: No discharge.     Conjunctiva/sclera: Conjunctivae normal.  Pulmonary:     Effort: Pulmonary effort is normal. No respiratory distress.  Skin:    Comments: Left thumb -1.5 cm linear laceration on the dorsum of the left thumb.  Neurological:     Mental Status: She is alert.  Psychiatric:        Mood and Affect: Mood normal.        Behavior: Behavior normal.    UC Treatments / Results  Labs (all labs ordered are listed, but only abnormal results are displayed) Labs Reviewed - No data to display  EKG None  Radiology No results found.  Procedures Laceration Repair Date/Time: 12/29/2018 5:46 PM Performed by: Coral Spikes, DO Authorized by: Coral Spikes, DO   Consent:    Consent obtained:  Verbal   Consent given by:  Patient Anesthesia (see MAR for exact dosages):    Anesthesia method:  Local infiltration   Local anesthetic:  Lidocaine 1% WITH epi Laceration details:    Location:  Finger   Finger location:  L thumb   Length (cm):  1.5 Repair type:    Repair type:  Simple  Pre-procedure details:    Preparation:  Patient was prepped and draped in usual sterile fashion Exploration:    Hemostasis achieved with:  Epinephrine and direct pressure   Wound exploration: wound explored through full range of motion     Contaminated: no   Treatment:    Area cleansed with:  Betadine   Amount of cleaning:  Standard Skin repair:    Repair method:  Sutures   Suture size:  5-0   Suture material:  Prolene   Suture technique:  Simple interrupted   Number of sutures:  3 Post-procedure details:    Dressing:  Antibiotic ointment and bulky dressing   (including critical care time)  Medications Ordered in UC Medications  tetanus & diphtheria toxoids (adult) (TENIVAC) injection 0.5 mL (0.5 mLs Intramuscular Given 12/29/18 1726)    Initial Impression / Assessment and Plan / UC Course  I have reviewed the triage vital signs and the nursing notes.  Pertinent labs & imaging results that were available during my care of the patient were reviewed by me and considered in my medical decision making (see chart for details).    73 year old female presents for laceration.  Repaired as above.  Sutures out in 7 days.  Tetanus given today.  Final Clinical Impressions(s) / UC Diagnoses   Final diagnoses:  Laceration of left thumb without foreign body without damage to nail, initial encounter     Discharge Instructions     Keep clean.  Sutures out in 7 days. You may make an appt if  you like.  Dressing off late tonight or tomorrow.  Take care  Dr. Lacinda Axon    ED Prescriptions    None     Controlled Substance Prescriptions  Controlled Substance Registry consulted? Not Applicable   Coral Spikes, DO 12/29/18 1755

## 2019-01-05 ENCOUNTER — Encounter: Payer: Self-pay | Admitting: Emergency Medicine

## 2019-01-05 ENCOUNTER — Ambulatory Visit
Admission: EM | Admit: 2019-01-05 | Discharge: 2019-01-05 | Disposition: A | Discharge status: Home / Self Care | Payer: Medicare Other

## 2019-01-05 ENCOUNTER — Other Ambulatory Visit: Payer: Self-pay

## 2019-01-05 NOTE — ED Triage Notes (Signed)
Patient here for suture removal to her left thumb.

## 2019-03-01 ENCOUNTER — Other Ambulatory Visit: Payer: Self-pay | Admitting: Family Medicine

## 2019-03-01 DIAGNOSIS — Z1231 Encounter for screening mammogram for malignant neoplasm of breast: Secondary | ICD-10-CM

## 2019-03-17 ENCOUNTER — Ambulatory Visit
Admission: RE | Admit: 2019-03-17 | Discharge: 2019-03-17 | Disposition: A | Discharge status: Home / Self Care | Payer: Medicare Other | Source: Ambulatory Visit | Attending: Family Medicine | Admitting: Family Medicine

## 2019-03-17 ENCOUNTER — Encounter (INDEPENDENT_AMBULATORY_CARE_PROVIDER_SITE_OTHER): Payer: Self-pay

## 2019-03-17 ENCOUNTER — Other Ambulatory Visit: Payer: Self-pay

## 2019-03-17 DIAGNOSIS — Z1231 Encounter for screening mammogram for malignant neoplasm of breast: Secondary | ICD-10-CM | POA: Diagnosis not present

## 2020-02-16 ENCOUNTER — Other Ambulatory Visit: Payer: Self-pay | Admitting: Family Medicine

## 2020-02-16 DIAGNOSIS — Z1231 Encounter for screening mammogram for malignant neoplasm of breast: Secondary | ICD-10-CM

## 2020-03-20 ENCOUNTER — Other Ambulatory Visit: Payer: Self-pay

## 2020-03-20 ENCOUNTER — Ambulatory Visit
Admission: RE | Admit: 2020-03-20 | Discharge: 2020-03-20 | Disposition: A | Discharge status: Home / Self Care | Payer: Medicare Other | Source: Ambulatory Visit | Attending: Family Medicine | Admitting: Family Medicine

## 2020-03-20 DIAGNOSIS — Z1231 Encounter for screening mammogram for malignant neoplasm of breast: Secondary | ICD-10-CM | POA: Insufficient documentation

## 2020-03-24 ENCOUNTER — Other Ambulatory Visit: Payer: Self-pay | Admitting: Family Medicine

## 2020-03-24 DIAGNOSIS — R928 Other abnormal and inconclusive findings on diagnostic imaging of breast: Secondary | ICD-10-CM

## 2020-03-24 DIAGNOSIS — N6489 Other specified disorders of breast: Secondary | ICD-10-CM

## 2020-03-31 ENCOUNTER — Ambulatory Visit
Admission: RE | Admit: 2020-03-31 | Discharge: 2020-03-31 | Disposition: A | Discharge status: Home / Self Care | Payer: Medicare Other | Source: Ambulatory Visit | Attending: Family Medicine | Admitting: Family Medicine

## 2020-03-31 ENCOUNTER — Other Ambulatory Visit: Payer: Self-pay

## 2020-03-31 DIAGNOSIS — N6489 Other specified disorders of breast: Secondary | ICD-10-CM

## 2020-03-31 DIAGNOSIS — R928 Other abnormal and inconclusive findings on diagnostic imaging of breast: Secondary | ICD-10-CM | POA: Diagnosis not present

## 2020-04-03 ENCOUNTER — Other Ambulatory Visit: Payer: Self-pay | Admitting: Family Medicine

## 2020-04-03 DIAGNOSIS — N63 Unspecified lump in unspecified breast: Secondary | ICD-10-CM

## 2020-04-03 DIAGNOSIS — R928 Other abnormal and inconclusive findings on diagnostic imaging of breast: Secondary | ICD-10-CM

## 2020-04-06 ENCOUNTER — Ambulatory Visit
Admission: EM | Admit: 2020-04-06 | Discharge: 2020-04-06 | Disposition: A | Discharge status: Home / Self Care | Payer: Medicare Other | Attending: Physician Assistant | Admitting: Physician Assistant

## 2020-04-06 DIAGNOSIS — L03818 Cellulitis of other sites: Secondary | ICD-10-CM

## 2020-04-06 DIAGNOSIS — I10 Essential (primary) hypertension: Secondary | ICD-10-CM | POA: Diagnosis not present

## 2020-04-06 MED ORDER — DOXYCYCLINE HYCLATE 100 MG PO CAPS: 100.0000 mg | ORAL_CAPSULE | Freq: Two times a day (BID) | ORAL | 0 refills | Status: AC

## 2020-04-06 NOTE — ED Provider Notes (Signed)
[facility]    CSN: [id number] Arrival date & time: 04/06/20  0802      History   Chief Complaint Chief Complaint  Patient presents with  . Insect Bite    on back    HPI Theresa Dodson is a 74 y.o. female.   74 y/o female presents for possible "insect bite" on her back x 2 weeks. She says she got out of the shower one day and saw it. She never felt any kind of sting or bite and never saw an insect. She says she has tried cortisone cream without relief.She says the area is red, slightly tender. She has not noticed any drainage and says it is not getting bigger over time. Denies fever, fatigue, body aches, n/v/d. Denies trauma/injury. Patient's medical history is significant for T2DM, chronic renal insufficiency--CKD stage 3, OSA, hypertesion, hyperlipidemia, skin cancer. Patient's BP is elevated today, states that happens when she comes into the doctor's office. BP is normally under good control. No other concerns today.      Past Medical History:  Diagnosis Date  . Cancer ([place])    skin ca  . Chronic renal insufficiency   . Difficult intubation   . Diverticulitis   . Generalized osteoarthritis   . GERD (gastroesophageal reflux disease)   . Gout   . Gout   . Hypercholesteremia   . Hypertension   . Pre-diabetes   . Sleep apnea     Patient Active Problem List   Diagnosis Date Noted  . Incisional hernia, without obstruction or gangrene   . Type 2 diabetes mellitus without complication, without long-term current use of insulin ([place]) 08/05/2017  . Allergic rhinitis 07/29/2017  . Back pain 07/29/2017  . Generalized osteoarthrosis, unspecified site 07/29/2017  . Hx of diverticulitis of colon 07/29/2017  . Hyperlipidemia, unspecified 07/29/2017  . CKD (chronic kidney disease) stage 3, GFR 30-59 ml/min 06/19/2014  . Hyperglycemia, unspecified 06/19/2014  . OSA on CPAP 05/07/2013  . SCIATICA, RIGHT 05/14/2010  . Pure hypercholesterolemia 04/05/2010  . KNEE  PAIN, RIGHT 02/26/2010  . BACK PAIN, LUMBAR, WITH RADICULOPATHY 02/09/2010  . GOUT 11/17/2009  . MOURNING 03/28/2009  . SKIN LESION 03/28/2009  . OTHER SPECIFIED DISORDER OF SKIN 02/14/2009  . UNSPECIFIED HYPOTHYROIDISM 12/20/2008  . ONYCHOMYCOSIS, BILATERAL 11/15/2008  . HYPERTENSION 07/05/2008  . GERD 07/05/2008    Past Surgical History:  Procedure Laterality Date  . breast benign mass removal  2012  . BREAST BIOPSY Right 07/2011   NEG  . CARPAL TUNNEL RELEASE Bilateral   . CATARACT EXTRACTION Right   . CHOLECYSTECTOMY    . COLONOSCOPY WITH PROPOFOL N/A 06/26/2017   Procedure: COLONOSCOPY WITH PROPOFOL;  Surgeon: Lollie Sails, MD;  Location: [facility] ENDOSCOPY;  Service: Endoscopy;  Laterality: N/A;  . FOOT SURGERY     d/t gout  . INCISIONAL HERNIA REPAIR N/A 08/13/2017   Procedure: HERNIA REPAIR INCISIONAL;  Surgeon: Clayburn Pert, MD;  Location: [facility] ORS;  Service: General;  Laterality: N/A;  . MANDIBLE SURGERY    . tooth abscess    . UMBILICAL HERNIA REPAIR N/A 08/13/2017   Procedure: HERNIA REPAIR UMBILICAL ADULT;  Surgeon: Clayburn Pert, MD;  Location: [facility] ORS;  Service: General;  Laterality: N/A;  . WRIST SURGERY      OB History   No obstetric history on file.      Home Medications    Prior to Admission medications   Medication Sig Start Date End Date Taking? Authorizing Provider  acetaminophen (  TYLENOL) 650 MG CR tablet Take 650 mg by mouth 2 (two) times daily.    Yes [provider]  allopurinol (ZYLOPRIM) 100 MG tablet Take 100 mg by mouth at bedtime.    Yes [provider]  allopurinol (ZYLOPRIM) 300 MG tablet Take 300 mg by mouth daily.   Yes [provider]  aspirin EC 81 MG tablet Take 81 mg by mouth at bedtime.    Yes [provider]  atorvastatin (LIPITOR) 10 MG tablet Take 10 mg by mouth at bedtime.    Yes [provider]  colchicine 0.6 MG tablet Take 0.6 mg by mouth daily as needed (pain).    Yes  [provider]  cyanocobalamin 1000 MCG tablet Take 1,000 mcg by mouth daily.    Yes [provider]  cyclobenzaprine (FLEXERIL) 10 MG tablet Take 10 mg by mouth 3 (three) times daily as needed for muscle spasms.   Yes [provider]  diphenoxylate-atropine (LOMOTIL) 2.5-0.025 MG tablet Take 1 tablet by mouth 4 (four) times daily as needed for diarrhea or loose stools. 04/12/18  Yes Cook, Jayce G, DO  docusate sodium (COLACE) 100 MG capsule Take 100 mg by mouth daily.   Yes [provider]  famotidine (PEPCID) 20 MG tablet Take 20 mg by mouth 2 (two) times daily as needed for heartburn.    Yes [provider]  fluticasone (FLONASE) 50 MCG/ACT nasal spray Place 1 spray into both nostrils 2 (two) times daily as needed for allergies or rhinitis (congestion).   Yes [provider]  guaiFENesin (MUCINEX) 600 MG 12 hr tablet Take 600 mg by mouth daily as needed for cough or to loosen phlegm (allergies).   Yes [provider]  lisinopril (PRINIVIL,ZESTRIL) 20 MG tablet  08/11/17  Yes [provider]  omega-3 acid ethyl esters (LOVAZA) 1 G capsule Take 2 g by mouth 2 (two) times daily.    Yes [provider]  Probiotic Product (PROBIOTIC PO) Take 1 tablet by mouth daily.   Yes [provider]  doxycycline (VIBRAMYCIN) 100 MG capsule Take 1 capsule (100 mg total) by mouth 2 (two) times daily for 10 days. 04/06/20 04/16/20  Danton Clap, PA-C    Family History Family History  Problem Relation Age of Onset  . Ovarian cancer Mother   . Stroke Father   . Hypertension Sister   . Heart disease Maternal Grandmother   . Heart disease Maternal Grandfather   . Heart disease Paternal Grandmother   . Diabetes Paternal Grandfather   . Breast cancer Neg Hx     Social History Social History   Tobacco Use  . Smoking status: Never Smoker  . Smokeless tobacco: Never Used  Vaping Use  . Vaping Use: Never used    Substance Use Topics  . Alcohol use: No  . Drug use: No     Allergies   Codeine   Review of Systems Review of Systems  Constitutional: Negative for chills, diaphoresis, fatigue and fever.  Respiratory: Negative for cough and shortness of breath.   Gastrointestinal: Negative for abdominal pain, nausea and vomiting.  Musculoskeletal: Negative for arthralgias, back pain, joint swelling and myalgias.  Skin: Positive for color change. Negative for rash.  Neurological: Negative for weakness and headaches.  Hematological: Negative for adenopathy.     Physical Exam Triage Vital Signs ED Triage Vitals  Enc Vitals Group     BP 04/06/20 0819 (!) 161/82     Pulse Rate 04/06/20  0819 74     Resp 04/06/20 0819 18     Temp 04/06/20 0818 98.1 F (36.7 C)     Temp Source 04/06/20 0818 Oral     SpO2 04/06/20 0819 99 %     Weight 04/06/20 0819 216 lb (98 kg)     Height 04/06/20 0819 5\' 2"  (1.575 m)     Head Circumference --      Peak Flow --      Pain Score 04/06/20 0818 2     Pain Loc --      Pain Edu? --      Excl. in [place]? --    No data found.  Updated Vital Signs BP (!) 161/82 (BP Location: Left Arm)   Pulse 74   Temp 98.1 F (36.7 C) (Oral)   Resp 18   Ht 5\' 2"  (1.575 m)   Wt 216 lb (98 kg)   SpO2 99%   BMI 39.51 kg/m       Physical Exam Vitals and nursing note reviewed.  Constitutional:      General: She is not in acute distress.    Appearance: Normal appearance. She is obese. She is not ill-appearing or toxic-appearing.  HENT:     Head: Normocephalic and atraumatic.  Eyes:     General: No scleral icterus.       Right eye: No discharge.        Left eye: No discharge.     Conjunctiva/sclera: Conjunctivae normal.  Cardiovascular:     Rate and Rhythm: Normal rate and regular rhythm.     Heart sounds: Normal heart sounds.  Pulmonary:     Effort: Pulmonary effort is normal. No respiratory distress.     Breath sounds: Normal breath sounds.  Musculoskeletal:      Cervical back: Neck supple.  Skin:    General: Skin is dry.     Comments: Of the upper mid-back-there is a 3 cm x 2 cm area of induration and erythema, no fluctuance or drainage. Area is mildly tender to palpation  Neurological:     General: No focal deficit present.     Mental Status: She is alert. Mental status is at baseline.     Motor: No weakness.     Gait: Gait normal.  Psychiatric:        Mood and Affect: Mood normal.        Behavior: Behavior normal.        Thought Content: Thought content normal.      UC Treatments / Results  Labs (all labs ordered are listed, but only abnormal results are displayed) Labs Reviewed - No data to display  EKG   Radiology No results found.  Procedures Procedures (including critical care time)  Medications Ordered in UC Medications - No data to display  Initial Impression / Assessment and Plan / UC Course  I have reviewed the triage vital signs and the nursing notes.  Pertinent labs & imaging results that were available during my care of the patient were reviewed by me and considered in my medical decision making (see chart for details).   Condition is consistent with possible infected sebaceous cyst. No fluctuance. Treating cellulitis with doxycyline at this time. Discussed following up with PCP or our clinic sooner if increasing redness, fever, pain, etc. May need to follow up with Derm for cyst removal once infection cleared.   Final Clinical Impressions(s) / UC Diagnoses   Final diagnoses:  Cellulitis of other specified site  Essential hypertension     Discharge Instructions     -The condition is consistent cellulitis, a type of soft tissue infection that does require an antibiotic so we will treat with antibiotics. Take Tylenol for pain as needed.Also use warm compresses to the area. Monitor for spreading redness, fluctuance, fever, drainage from the area, or increased pain. Follow up in 2-3 days with PCP If possible or  our office sooner if condition is not improving or if especially worsening.   Follow up with PCP about elevated Bps if consistently >140/90    ED Prescriptions    Medication Sig Dispense Auth. Provider   doxycycline (VIBRAMYCIN) 100 MG capsule Take 1 capsule (100 mg total) by mouth 2 (two) times daily for 10 days. 20 capsule Danton Clap, PA-C     PDMP not reviewed this encounter.   Danton Clap, PA-C 04/06/20 [phone]

## 2020-04-06 NOTE — ED Triage Notes (Signed)
Patient in today for an insect bite on her back. Patient states the bite occurred approx. 2 weeks ago. Patient has tried cortisone cream on it w/ little to no improvement.

## 2020-04-06 NOTE — Discharge Instructions (Signed)
-  The condition is consistent cellulitis, a type of soft tissue infection that does require an antibiotic so we will treat with antibiotics. Take Tylenol for pain as needed.Also use warm compresses to the area. Monitor for spreading redness, fluctuance, fever, drainage from the area, or increased pain. Follow up in 2-3 days with PCP If possible or our office sooner if condition is not improving or if especially worsening.   Follow up with PCP about elevated Bps if consistently >140/90

## 2020-07-03 DIAGNOSIS — K635 Polyp of colon: Secondary | ICD-10-CM | POA: Insufficient documentation

## 2020-10-02 ENCOUNTER — Other Ambulatory Visit: Payer: Self-pay

## 2020-10-02 ENCOUNTER — Ambulatory Visit
Admission: RE | Admit: 2020-10-02 | Discharge: 2020-10-02 | Disposition: A | Discharge status: Home / Self Care | Payer: Medicare Other | Source: Ambulatory Visit | Attending: Family Medicine | Admitting: Family Medicine

## 2020-10-02 DIAGNOSIS — N63 Unspecified lump in unspecified breast: Secondary | ICD-10-CM

## 2020-10-02 DIAGNOSIS — R928 Other abnormal and inconclusive findings on diagnostic imaging of breast: Secondary | ICD-10-CM

## 2020-10-09 ENCOUNTER — Other Ambulatory Visit: Payer: Self-pay | Admitting: Family Medicine

## 2020-10-09 DIAGNOSIS — N6310 Unspecified lump in the right breast, unspecified quadrant: Secondary | ICD-10-CM

## 2020-10-23 ENCOUNTER — Other Ambulatory Visit: Payer: Self-pay

## 2020-10-23 ENCOUNTER — Encounter: Payer: Self-pay | Admitting: Surgery

## 2020-10-23 ENCOUNTER — Ambulatory Visit (INDEPENDENT_AMBULATORY_CARE_PROVIDER_SITE_OTHER): Payer: Medicare Other | Admitting: Surgery

## 2020-10-23 VITALS — BP 156/75 | HR 79 | Temp 98.9°F | Ht 62.0 in | Wt 228.4 lb

## 2020-10-23 DIAGNOSIS — R928 Other abnormal and inconclusive findings on diagnostic imaging of breast: Secondary | ICD-10-CM | POA: Diagnosis not present

## 2020-10-23 DIAGNOSIS — N63 Unspecified lump in unspecified breast: Secondary | ICD-10-CM

## 2020-10-23 DIAGNOSIS — Z87898 Personal history of other specified conditions: Secondary | ICD-10-CM | POA: Diagnosis not present

## 2020-10-23 DIAGNOSIS — Z9289 Personal history of other medical treatment: Secondary | ICD-10-CM

## 2020-10-23 DIAGNOSIS — N6310 Unspecified lump in the right breast, unspecified quadrant: Secondary | ICD-10-CM

## 2020-10-23 NOTE — Patient Instructions (Addendum)
An order has been placed for an ultrasound biopsy of the right breast. I will call you with the appointment for [place]. If you have any concerns or questions, please feel free to call our office.     Post Breast Biopsy Instructions   There are no restrictions following your image guided breast biopsy.  If you experience discomfort in your breast with exercise or heavy lifting you may want to minimize these activities for a 24 to 48 hour period.   Keep ice on the biopsy site until bedtime.  As the ice pack thaws replace it with the second pack from the freezer.  The ice packs we provide refreeze in approximately 40 minutes or so.    Consider wearing a bra to bed tonight, for added support.   Most patients experience minimal if any discomfort at the biopsy site, however, if needed please use Tylenol.  Please avoid Aspirin for 24 hours following the biopsy.   We recommend that you not soak your breast (e.g. Bathing in a tub or pool) for 24 hours following your biopsy.  You can shower at any time.   You may notice a small amount of blood or oozing of bodily fluid at the biopsy site.  If you experience excessive bleeding, hold firm pressure over the biopsy area for 10 minutes.  If the bleeding continues or you have other concerns please call the Breast Center or, after hours, page the radiologist who did your biopsy.   Keep the steri-strips we apply at the biopsy site on until you return to the [facility] for results.

## 2020-10-23 NOTE — Progress Notes (Signed)
10/23/2020  Reason for Visit:  Right breast mass  Referring Provider:  Gayland Curry, MD  History of Present Illness: Theresa Dodson is a 75 y.o. female presenting for evaluation of a right breast mass.  She's s/p right breast excisional biopsy in 2012.  Unfortunately I cannot find any records showing the op note or pathology report from this mass, but appears that Dr. Burt Knack saw her for the mass.  She reports her surgery was done in [place] and reports it was a benign mass, she thinks fatty tissue.  She has had regular screening mammograms since and on her last bilateral screening mammogram on 03/21/20, there was an area of asymmetry in the right breast.  She then had a diagnostic mammogram and U/S which showed a 9 x 9 x4 mm mass at 2 o'clock position, 8 cm from nipple.  It was believed to be benign, such as a hemangioma or fat necrosis.  A follow up mammogram and U/S was done 6 months later on 10/02/20, which confirmed the same mass, overall stable with size 9 x 9 x 5 mm, again suspected to be benign.  As a precaution, given that the mass is in similar area to her prior breast excisional biopsy, she was referred to [place] for further evaluation and discussion.  The patient denies any recent changes, denies any palpable masses, skin changes, nipple changes or drainage.  Does not have any personal or family history of breast cancer.  Past Medical History: Past Medical History:  Diagnosis Date  . Cancer ([place])    skin ca  . Chronic renal insufficiency   . Difficult intubation   . Diverticulitis   . Generalized osteoarthritis   . GERD (gastroesophageal reflux disease)   . Gout   . Gout   . Hypercholesteremia   . Hypertension   . Pre-diabetes   . Sleep apnea      Past Surgical History: Past Surgical History:  Procedure Laterality Date  . breast benign mass removal  2012  . BREAST BIOPSY Right 07/2011   NEG  . CARPAL TUNNEL RELEASE Bilateral   . CATARACT EXTRACTION Right   .  CHOLECYSTECTOMY    . COLONOSCOPY WITH PROPOFOL N/A 06/26/2017   Procedure: COLONOSCOPY WITH PROPOFOL;  Surgeon: Lollie Sails, MD;  Location: [facility] ENDOSCOPY;  Service: Endoscopy;  Laterality: N/A;  . FOOT SURGERY     d/t gout  . INCISIONAL HERNIA REPAIR N/A 08/13/2017   Procedure: HERNIA REPAIR INCISIONAL;  Surgeon: Clayburn Pert, MD;  Location: [facility] ORS;  Service: General;  Laterality: N/A;  . MANDIBLE SURGERY    . tooth abscess    . UMBILICAL HERNIA REPAIR N/A 08/13/2017   Procedure: HERNIA REPAIR UMBILICAL ADULT;  Surgeon: Clayburn Pert, MD;  Location: [facility] ORS;  Service: General;  Laterality: N/A;  . WRIST SURGERY      Home Medications: Prior to Admission medications   Medication Sig Start Date End Date Taking? Authorizing Provider  acetaminophen (TYLENOL) 650 MG CR tablet Take 650 mg by mouth 2 (two) times daily.   Yes [provider]  allopurinol (ZYLOPRIM) 100 MG tablet Take 100 mg by mouth at bedtime.    Yes [provider]  allopurinol (ZYLOPRIM) 300 MG tablet Take 300 mg by mouth daily.   Yes [provider]  amLODipine (NORVASC) 5 MG tablet Take 5 mg by mouth daily.   Yes [provider]  aspirin EC 81 MG tablet Take 81 mg by mouth at bedtime.  Yes [provider]  atorvastatin (LIPITOR) 10 MG tablet Take 10 mg by mouth at bedtime.    Yes [provider]  colchicine 0.6 MG tablet Take 0.6 mg by mouth daily as needed (pain).    Yes [provider]  cyanocobalamin 1000 MCG tablet Take 1,000 mcg by mouth daily.    Yes [provider]  cyclobenzaprine (FLEXERIL) 10 MG tablet Take 10 mg by mouth 3 (three) times daily as needed for muscle spasms.   Yes [provider]  docusate sodium (COLACE) 100 MG capsule Take 100 mg by mouth daily.   Yes [provider]  famotidine (PEPCID) 20 MG tablet Take 20 mg by mouth 2 (two) times daily as needed for heartburn.    Yes [provider]  fluticasone (FLONASE) 50 MCG/ACT nasal spray Place 1 spray into both nostrils 2 (two) times daily as needed for allergies or rhinitis (congestion).   Yes [provider]  gabapentin (NEURONTIN) 600 MG tablet Take 600 mg by mouth 3 (three) times daily.   Yes [provider]  guaiFENesin (MUCINEX) 600 MG 12 hr tablet Take 600 mg by mouth daily as needed for cough or to loosen phlegm (allergies).   Yes [provider]  hydrochlorothiazide (HYDRODIURIL) 25 MG tablet Take 25 mg by mouth daily.   Yes [provider]  lisinopril (PRINIVIL,ZESTRIL) 20 MG tablet  08/11/17  Yes [provider]  omega-3 acid ethyl esters (LOVAZA) 1 G capsule Take 2 g by mouth 2 (two) times daily.    Yes [provider]  Probiotic Product (PROBIOTIC PO) Take 1 tablet by mouth daily.   Yes [provider]    Allergies: Allergies  Allergen Reactions  . Codeine Nausea And Vomiting    Social History:  reports that she has never smoked. She has never used smokeless tobacco. She reports that she does not drink alcohol and does not use drugs.   Family History: Family History  Problem Relation Age of Onset  . Ovarian cancer Mother   . Stroke Father   . Hypertension Sister   . Heart disease Maternal Grandmother   . Heart disease Maternal Grandfather   . Heart disease Paternal Grandmother   . Diabetes Paternal Grandfather   . Breast cancer Neg Hx     Review of Systems: Review of Systems  Constitutional: Negative for chills, fever and weight loss.  Respiratory: Negative for shortness of breath.   Cardiovascular: Negative for chest pain.  Gastrointestinal: Negative for abdominal pain, nausea and vomiting.  Musculoskeletal: Negative for myalgias.  Skin: Negative for rash.    Physical Exam BP (!) 156/75   Pulse 79   Temp 98.9 F (37.2 C) (Oral)   Ht 5\' 2"  (1.575 m)   Wt 228 lb 6.4 oz (103.6 kg)   SpO2 95%   BMI 41.77 kg/m  CONSTITUTIONAL: No  acute distress HEENT:  Normocephalic, atraumatic, extraocular motion intact. RESPIRATORY:  Normal respiratory effort without pathologic use of accessory muscles. CARDIOVASCULAR:  Regular rhythm and rate. BREAST: Right breast has a faint healed scar in the medial aspect around 3 o'clock position.  No true palpable masses, no skin changes, or nipple changes.  No right axillary lymphadenopathy.  Left breast without any skin changes, palpable masses, or nipple changes.  No left axillary lymphadenopathy. NEUROLOGIC:  Cranial nerves are grossly intact. PSYCH:  Alert and oriented to person, place and time. Affect is normal.  Laboratory Analysis: No results found for this or any  previous visit (from the past 24 hour(s)).  Imaging: Right Breast Mammogram and [place] 10/02/20: FINDINGS: The previously demonstrated oval, low density asymmetry with interspersed fat in the posterior 2 o'clock position of the right breast is unchanged since 03/31/2020 and 03/20/2020. No interval findings suspicious for malignancy.  Targeted ultrasound is performed, showing a 9 x 9 x 5 mm oval echogenic mass-like area within a fat lobule in the 2 o'clock position of the right breast, 8 cm from the nipple. This has some circumscribed and some indistinct margins. This measured 9 x 9 x 4 mm on 03/31/2020.  IMPRESSION: 1. Stable 9 mm probably benign mass in the 2 o'clock position of the right breast. This could represent fat necrosis or an hemangioma. 2. No evidence of malignancy elsewhere in either breast.  RECOMMENDATION: Bilateral diagnostic mammogram and right breast ultrasound in 6 months.  Assessment and Plan: This is a 75 y.o. female with a right breast mass.  --Discussed with the patient the findings on her initial mammograms last year and the findings on this new mammogram last month.  Overall, the size of the mass is stable, with benign appearance.  Discussed with her that the fact that over the past 6 months  there was been no growth is favorable for a benign mass.  However, in order to be more certain, a biopsy would be required.  Discussed with the patient the different treatment plans at this point, which would range from watchful waiting and doing repeat mammogram and [place] as recommended in August 2022, vs doing a biopsy of the mass, vs doing another excisional biopsy as was done in 2012.  She would like to proceed with a biopsy of the mass.  Will place order for biopsy of the right breast at [facility].  I will call the patient with the results and discussed that we may need to do an appointment to discuss further findings/plans if there are any concerns.  If the mass is benign, then we'll arrange for the mammogram and follow up with [place] in August.  Face-to-face time spent with the patient and care providers was 40 minutes, with more than 50% of the time spent counseling, educating, and coordinating care of the patient.     Melvyn Neth, [facility]

## 2020-11-14 ENCOUNTER — Other Ambulatory Visit: Payer: Self-pay | Admitting: Surgery

## 2020-11-14 DIAGNOSIS — N6310 Unspecified lump in the right breast, unspecified quadrant: Secondary | ICD-10-CM

## 2020-11-17 ENCOUNTER — Other Ambulatory Visit: Payer: Self-pay

## 2020-11-17 ENCOUNTER — Ambulatory Visit
Admission: RE | Admit: 2020-11-17 | Discharge: 2020-11-17 | Disposition: A | Discharge status: Home / Self Care | Payer: Medicare Other | Source: Ambulatory Visit | Attending: Surgery | Admitting: Surgery

## 2020-11-17 DIAGNOSIS — N6310 Unspecified lump in the right breast, unspecified quadrant: Secondary | ICD-10-CM

## 2020-11-17 DIAGNOSIS — D171 Benign lipomatous neoplasm of skin and subcutaneous tissue of trunk: Secondary | ICD-10-CM | POA: Insufficient documentation

## 2020-11-17 DIAGNOSIS — N6312 Unspecified lump in the right breast, upper inner quadrant: Secondary | ICD-10-CM | POA: Diagnosis present

## 2020-11-17 HISTORY — PX: BREAST BIOPSY: SHX20

## 2020-11-20 LAB — SURGICAL PATHOLOGY

## 2020-11-22 ENCOUNTER — Telehealth: Payer: Self-pay

## 2020-11-22 NOTE — Telephone Encounter (Signed)
LVM for pt to call the office for test results.

## 2020-11-23 ENCOUNTER — Telehealth: Payer: Self-pay | Admitting: Surgery

## 2020-11-23 NOTE — Telephone Encounter (Signed)
Inbound call from pt, returning call from [facility], [place] to discuss recent test results.  Please advise by calling back on cell @ [phone].  Thank you

## 2020-11-24 ENCOUNTER — Telehealth: Payer: Self-pay

## 2020-11-24 NOTE — Telephone Encounter (Signed)
Pt notified of test results. Verbalizes understanding.

## 2020-11-24 NOTE — Telephone Encounter (Signed)
Pt contacted & documented in separate telephone encounter.

## 2021-02-21 ENCOUNTER — Other Ambulatory Visit: Payer: Self-pay | Admitting: Family Medicine

## 2021-02-21 DIAGNOSIS — Z1231 Encounter for screening mammogram for malignant neoplasm of breast: Secondary | ICD-10-CM

## 2021-03-21 ENCOUNTER — Other Ambulatory Visit: Payer: Self-pay

## 2021-03-21 ENCOUNTER — Ambulatory Visit
Admission: RE | Admit: 2021-03-21 | Discharge: 2021-03-21 | Disposition: A | Discharge status: Home / Self Care | Payer: Medicare Other | Source: Ambulatory Visit | Attending: Family Medicine | Admitting: Family Medicine

## 2021-03-21 DIAGNOSIS — Z1231 Encounter for screening mammogram for malignant neoplasm of breast: Secondary | ICD-10-CM | POA: Insufficient documentation

## 2021-03-27 ENCOUNTER — Other Ambulatory Visit: Payer: Self-pay | Admitting: Family Medicine

## 2021-03-27 DIAGNOSIS — R928 Other abnormal and inconclusive findings on diagnostic imaging of breast: Secondary | ICD-10-CM

## 2021-03-27 DIAGNOSIS — R921 Mammographic calcification found on diagnostic imaging of breast: Secondary | ICD-10-CM

## 2021-03-29 ENCOUNTER — Ambulatory Visit
Admission: RE | Admit: 2021-03-29 | Discharge: 2021-03-29 | Disposition: A | Discharge status: Home / Self Care | Payer: Medicare Other | Source: Ambulatory Visit | Attending: Family Medicine | Admitting: Family Medicine

## 2021-03-29 ENCOUNTER — Other Ambulatory Visit: Payer: Self-pay

## 2021-03-29 DIAGNOSIS — R928 Other abnormal and inconclusive findings on diagnostic imaging of breast: Secondary | ICD-10-CM | POA: Insufficient documentation

## 2021-03-29 DIAGNOSIS — R921 Mammographic calcification found on diagnostic imaging of breast: Secondary | ICD-10-CM | POA: Diagnosis present

## 2021-03-30 ENCOUNTER — Other Ambulatory Visit: Payer: Self-pay | Admitting: Family Medicine

## 2021-03-30 DIAGNOSIS — R928 Other abnormal and inconclusive findings on diagnostic imaging of breast: Secondary | ICD-10-CM

## 2021-03-30 DIAGNOSIS — R921 Mammographic calcification found on diagnostic imaging of breast: Secondary | ICD-10-CM

## 2021-04-03 ENCOUNTER — Other Ambulatory Visit: Payer: Self-pay

## 2021-04-03 ENCOUNTER — Ambulatory Visit
Admission: RE | Admit: 2021-04-03 | Discharge: 2021-04-03 | Disposition: A | Discharge status: Home / Self Care | Payer: Medicare Other | Source: Ambulatory Visit | Attending: Family Medicine | Admitting: Family Medicine

## 2021-04-03 DIAGNOSIS — R928 Other abnormal and inconclusive findings on diagnostic imaging of breast: Secondary | ICD-10-CM

## 2021-04-03 DIAGNOSIS — R921 Mammographic calcification found on diagnostic imaging of breast: Secondary | ICD-10-CM | POA: Diagnosis present

## 2021-04-03 HISTORY — PX: BREAST BIOPSY: SHX20

## 2021-04-04 LAB — SURGICAL PATHOLOGY

## 2021-05-28 DIAGNOSIS — Z6839 Body mass index (BMI) 39.0-39.9, adult: Secondary | ICD-10-CM | POA: Insufficient documentation

## 2021-06-13 IMAGING — MG MM DIGITAL DIAGNOSTIC UNILAT*R* W/ TOMO W/ CAD
6 series · 6 of 18 positions shown · non-contrast
Comparison: March 20, 2020 and earlier priors

ACR Breast Density Category a: The breast tissue is almost entirely
fatty.

CLINICAL DATA: 73-year-old patient recalled from recent screening
mammogram for evaluation of possible asymmetry in the medial right
breast. The patient has a history of a benign excisional biopsy in
the upper inner quadrant of the right breast 8908.

EXAM:
DIGITAL DIAGNOSTIC RIGHT MAMMOGRAM WITH CAD AND TOMO
ULTRASOUND RIGHT BREAST

[R ML synth-2D]
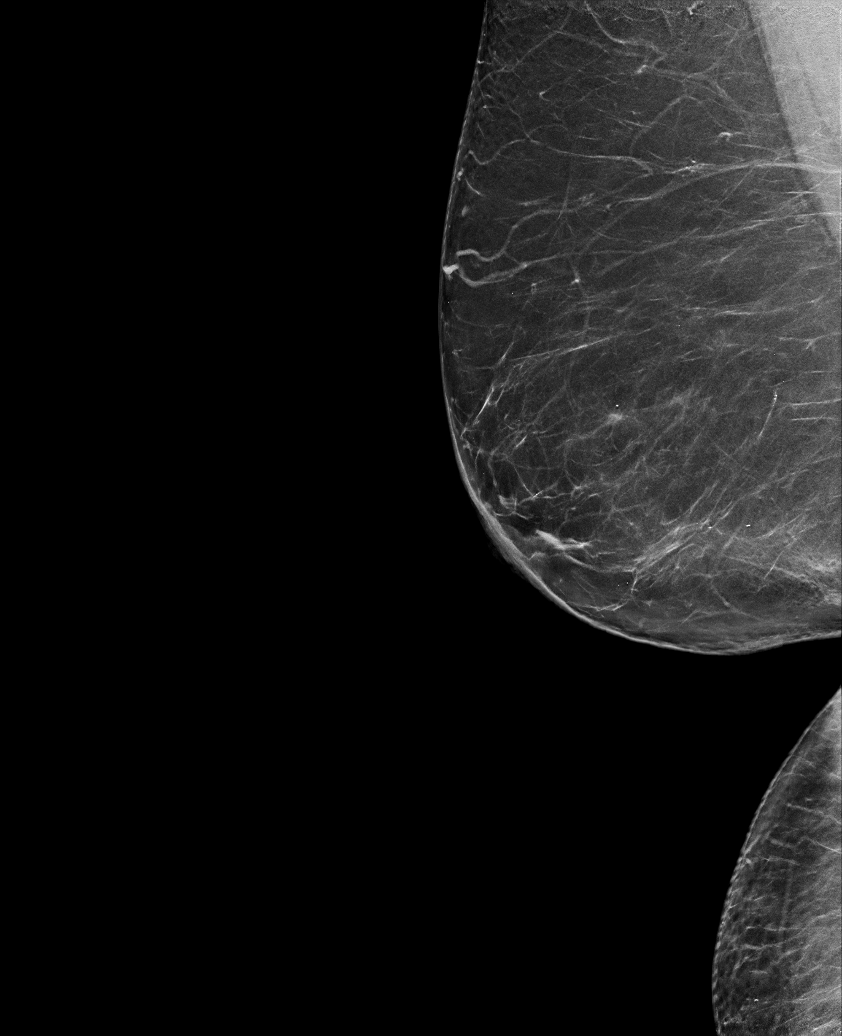

[R CC synth-2D (1 of 2)]
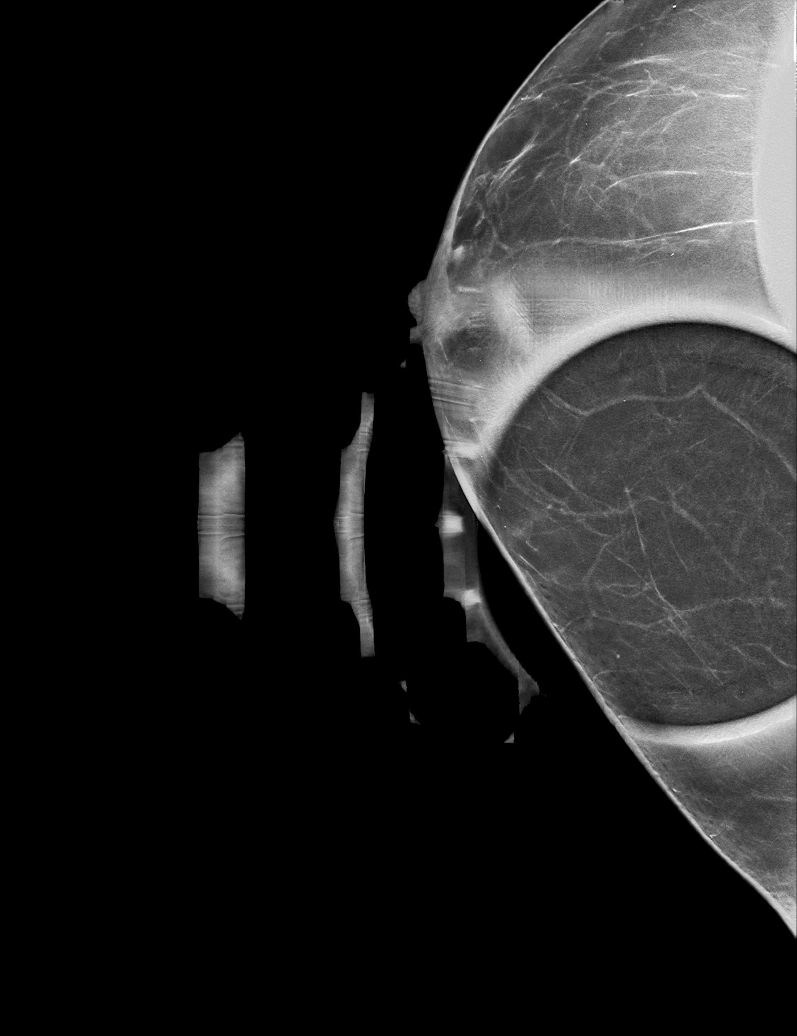

[R CC synth-2D (2 of 2)]
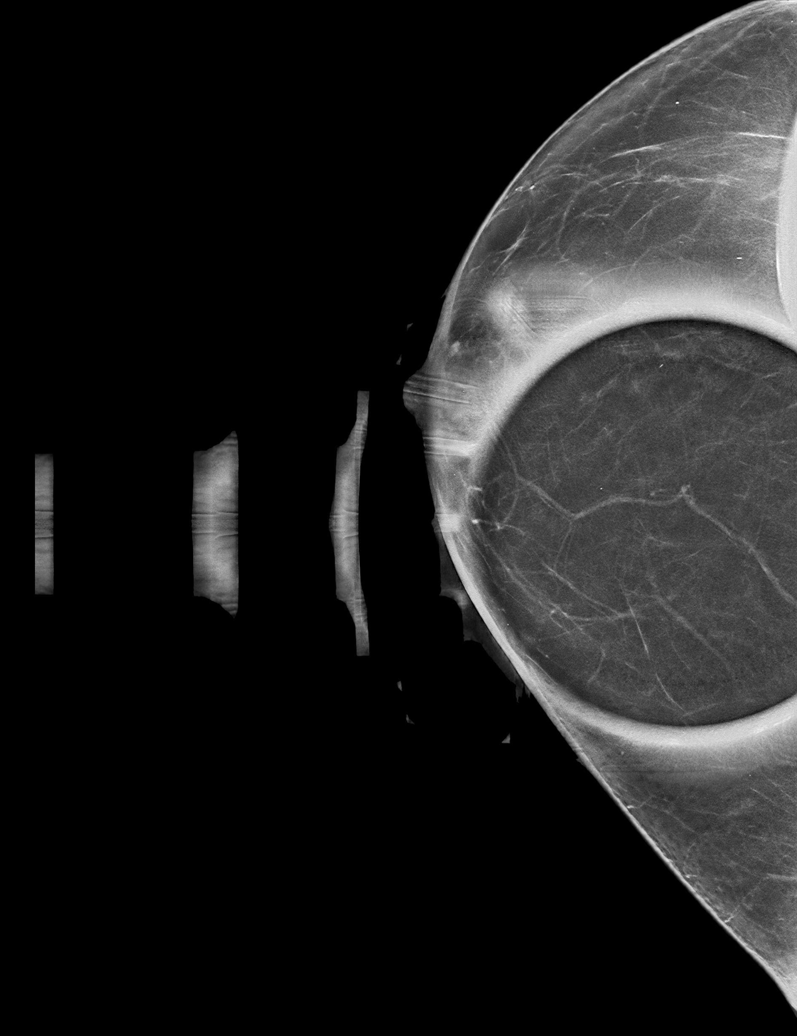

[R CC tomo (1 of 2) · tomo slice 28/55.0]
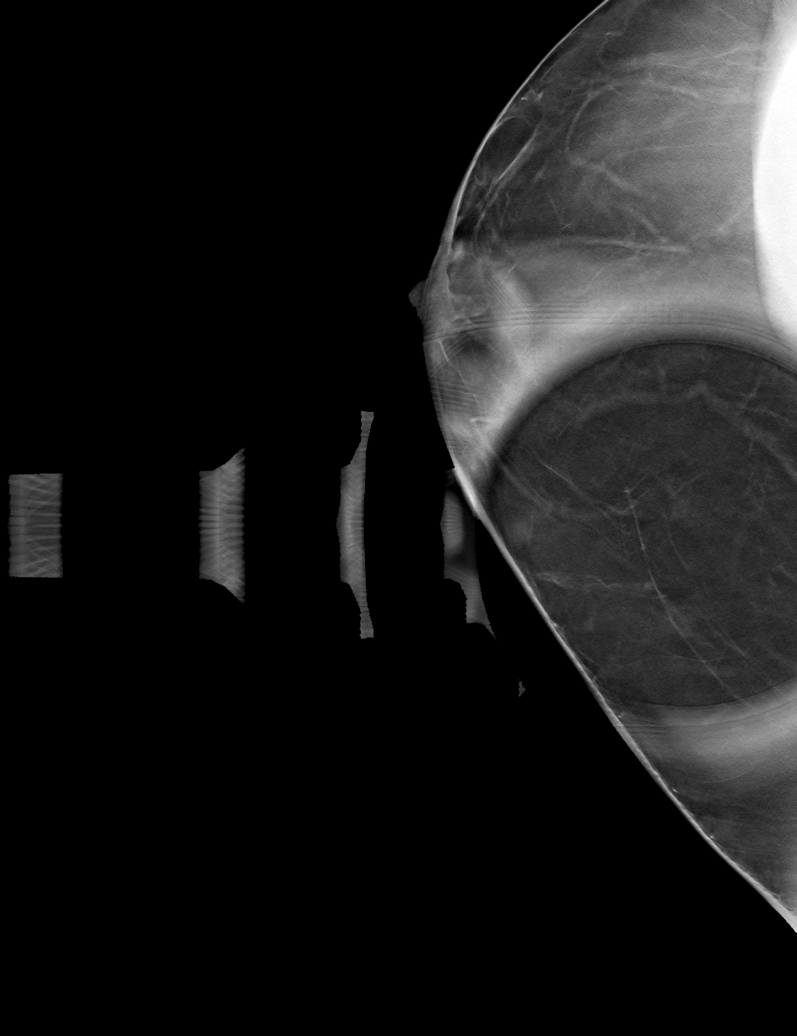

[R ML tomo · tomo slice 41/81.0]
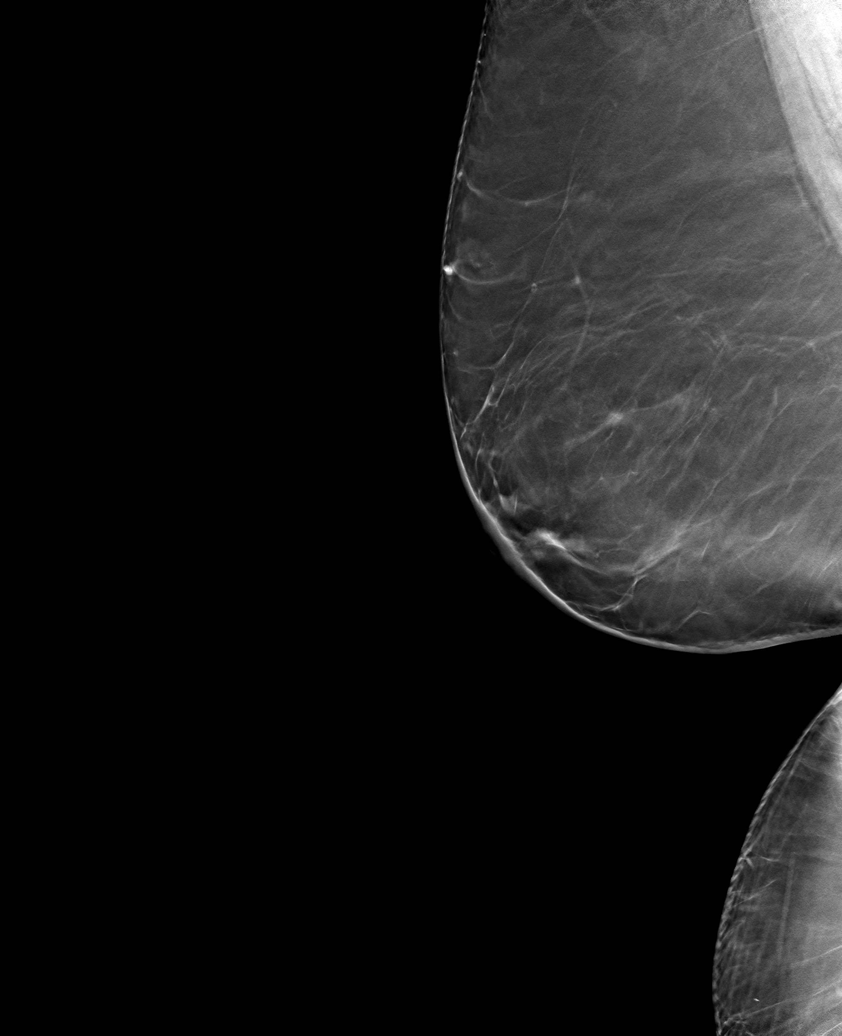

[R CC tomo (2 of 2) · tomo slice 31/60.0]
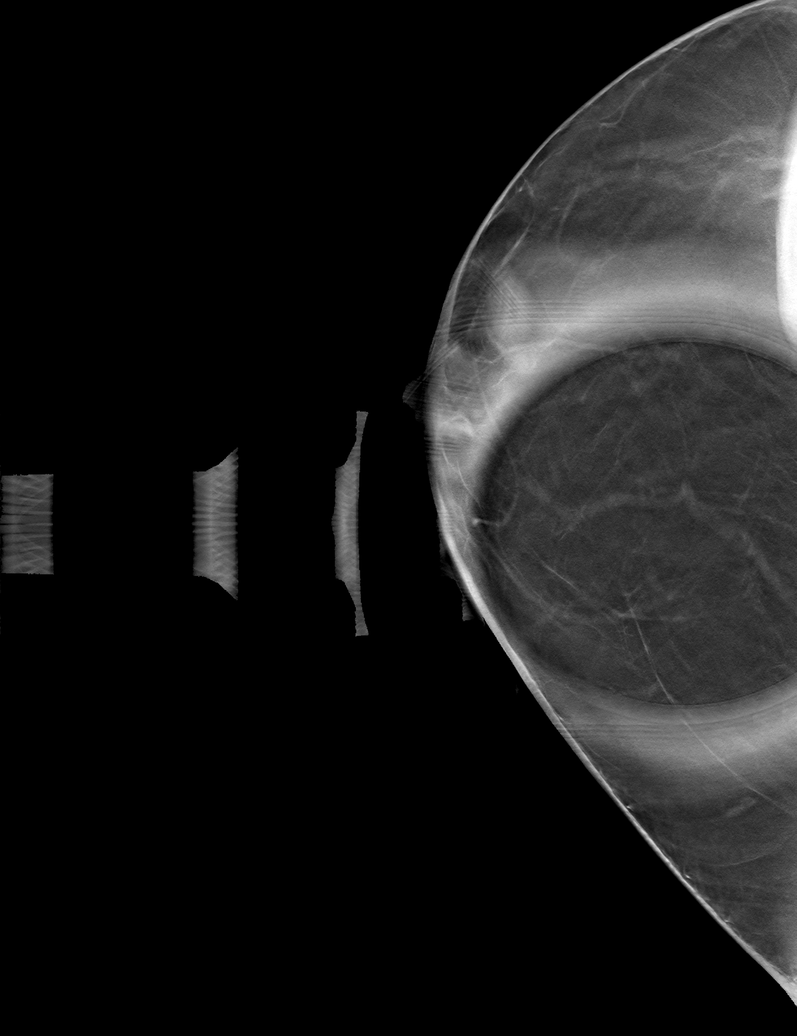

[6 of 18 positions shown; findings below may reference images not displayed]

FINDINGS: There is a low-density asymmetry with probable internal fat density
in the medial right breast, upper inner quadrant, best seen in the
craniocaudal projection, but with a faint correlate seen superiorly
in the 90 degree lateral image performed today.

Mammographic images were processed with CAD.

On physical exam, healed scar in the periareolar upper inner left
breast. No mass is palpated in the upper inner quadrant of the left
breast.

Targeted ultrasound is performed, showing a focal area of
hyperechogenicity within a fat lobule at 2 o'clock position 8 cm
from the nipple and measures 9 x 4 x 9 mm. No internal vascular flow
is identified. There is a vessel seen superficial to it. Overlying
skin thickness is normal.
IMPRESSION: Probably benign mass in the upper inner quadrant of the right
breast. Possibilities include benign hemangioma or area of fat
necrosis related to prior trauma.

RECOMMENDATION:
Diagnostic right mammogram and right breast ultrasound is
recommended in 6 months. The patient was also given the option of at
a an ultrasound-guided core needle biopsy. At this point, she
prefers imaging follow-up.

I have discussed the findings and recommendations with the patient.
If applicable, a reminder letter will be sent to the patient
regarding the next appointment.

BI-RADS CATEGORY  3: Probably benign.

## 2021-09-11 ENCOUNTER — Other Ambulatory Visit: Payer: Self-pay | Admitting: Nephrology

## 2021-09-11 DIAGNOSIS — N1832 Chronic kidney disease, stage 3b: Secondary | ICD-10-CM

## 2021-09-11 DIAGNOSIS — E1122 Type 2 diabetes mellitus with diabetic chronic kidney disease: Secondary | ICD-10-CM

## 2021-09-18 ENCOUNTER — Ambulatory Visit
Admission: RE | Admit: 2021-09-18 | Discharge: 2021-09-18 | Disposition: A | Discharge status: Home / Self Care | Payer: Medicare Other | Source: Ambulatory Visit | Attending: Nephrology | Admitting: Nephrology

## 2021-09-18 ENCOUNTER — Other Ambulatory Visit: Payer: Self-pay

## 2021-09-18 DIAGNOSIS — E1122 Type 2 diabetes mellitus with diabetic chronic kidney disease: Secondary | ICD-10-CM | POA: Diagnosis not present

## 2021-09-18 DIAGNOSIS — N1832 Chronic kidney disease, stage 3b: Secondary | ICD-10-CM | POA: Insufficient documentation

## 2021-09-27 ENCOUNTER — Other Ambulatory Visit: Payer: Self-pay | Admitting: Nephrology

## 2021-09-27 ENCOUNTER — Other Ambulatory Visit (HOSPITAL_COMMUNITY): Payer: Self-pay | Admitting: Nephrology

## 2021-09-27 DIAGNOSIS — E1122 Type 2 diabetes mellitus with diabetic chronic kidney disease: Secondary | ICD-10-CM

## 2021-09-27 DIAGNOSIS — I10 Essential (primary) hypertension: Secondary | ICD-10-CM

## 2021-09-27 DIAGNOSIS — E871 Hypo-osmolality and hyponatremia: Secondary | ICD-10-CM

## 2021-09-27 DIAGNOSIS — N1832 Chronic kidney disease, stage 3b: Secondary | ICD-10-CM

## 2021-10-11 ENCOUNTER — Ambulatory Visit
Admission: RE | Admit: 2021-10-11 | Discharge: 2021-10-11 | Disposition: A | Discharge status: Home / Self Care | Payer: Medicare Other | Source: Ambulatory Visit | Attending: Nephrology | Admitting: Nephrology

## 2021-10-11 ENCOUNTER — Other Ambulatory Visit: Payer: Self-pay

## 2021-10-11 DIAGNOSIS — I10 Essential (primary) hypertension: Secondary | ICD-10-CM | POA: Diagnosis present

## 2021-10-11 DIAGNOSIS — N1832 Chronic kidney disease, stage 3b: Secondary | ICD-10-CM | POA: Diagnosis present

## 2021-10-11 DIAGNOSIS — E871 Hypo-osmolality and hyponatremia: Secondary | ICD-10-CM | POA: Diagnosis present

## 2021-10-11 DIAGNOSIS — E1122 Type 2 diabetes mellitus with diabetic chronic kidney disease: Secondary | ICD-10-CM | POA: Insufficient documentation

## 2021-10-11 MED ORDER — GADOBUTROL 1 MMOL/ML IV SOLN
10.0000 mL | Freq: Once | INTRAVENOUS | Status: AC | PRN
  Administered 2021-10-11: 10 mL via INTRAVENOUS

## 2021-10-22 NOTE — Progress Notes (Signed)
10/22/21 12:48 PM   Theresa Dodson [birth year] [id number]  Referring provider:  Anthonette Legato, MD [place],  [place] [address] No chief complaint on file.    HPI: Theresa Dodson is a 76 y.o.female who presents today for further evaluation of left kidney mass.   She has a personal history of CKD IIIb and OSA. She is followed by Dr Holley Raring in nephrology for her CKD.   On 09/11/2020 creatinine was 1.1 with an EGFR of 49. Subsequent to this on 05/28/2021 creatinine was up to 1.2 and EGFR 47.  She was seen on 09/11/2021 by Dr. Holley Raring who ordered a RUS to further evaluate her CKD. RUS on 09/18/2021 visualized bilateral renal cyst and a 2.4 cm solid left kidney lesion.   MRI of abdomen on 10/11/2021 to further evaluate left kidney lesions visualized a 2.1 cm solid enhancing lesion arising off the lateral cortex of the upper pole of the left kidney compatible with renal cell carcinoma. No signs of nodal or solid organ metastasis within the imaged portions of the abdomen. The left renal vein appears patent.  Bilateral kidney cysts.  In hindsight, when reviewing previous cross-sectional imaging, the lesion in question was probably present CT abdomen pelvis with contrast from 2018, measuring about 1.4 cm cm at the time in the axial plane.  Previous abdominal surgeries include cholecystectomy as well as incisional and umbilical hernia repair with mesh.   PMH: Past Medical History:  Diagnosis Date   Cancer ([place])    skin ca   Chronic renal insufficiency    Difficult intubation    Diverticulitis    Generalized osteoarthritis    GERD (gastroesophageal reflux disease)    Gout    Gout    Hypercholesteremia    Hypertension    Pre-diabetes    Sleep apnea     Surgical History: Past Surgical History:  Procedure Laterality Date   breast benign mass removal  2012   BREAST BIOPSY Right 07/2011   NEG   BREAST BIOPSY Right 11/17/2020   U/ S bx, Q,  marker ANGIOLIPOMA. -  NEGATIVE FOR MALIGNANCY.   BREAST BIOPSY Right 04/03/2021   stereo calcs, x marker, path pending   CARPAL TUNNEL RELEASE Bilateral    CATARACT EXTRACTION Right    CHOLECYSTECTOMY     COLONOSCOPY WITH PROPOFOL N/A 06/26/2017   Procedure: COLONOSCOPY WITH PROPOFOL;  Surgeon: Lollie Sails, MD;  Location: [facility] END[facility]COPY;  Service: Endoscopy;  Laterality: N/A;   FOOT SURGERY     d/t gout   INCISIONAL HERNIA REPAIR N/A 08/13/2017   Procedure: HERNIA REPAIR INCISIONAL;  Surgeon: Clayburn Pert, MD;  Location: [facility] ORS;  Service: General;  Laterality: N/A;   MANDIBLE SURGERY     tooth abscess     UMBILICAL HERNIA REPAIR N/A 08/13/2017   Procedure: HERNIA REPAIR UMBILICAL ADULT;  Surgeon: Clayburn Pert, MD;  Location: [facility] ORS;  Service: General;  Laterality: N/A;   WRIST SURGERY      Home Medications:  Allergies as of 10/23/2021       Reactions   Codeine Nausea And Vomiting        Medication List        Accurate as of October 22, 2021 12:48 PM. If you have any questions, ask your nurse or doctor.          acetaminophen 650 MG CR tablet Commonly known as: TYLENOL Take 650 mg by mouth 2 (two) times daily.   allopurinol  300 MG tablet Commonly known as: ZYLOPRIM Take 300 mg by mouth daily.   allopurinol 100 MG tablet Commonly known as: ZYLOPRIM Take 100 mg by mouth at bedtime.   amLODipine 5 MG tablet Commonly known as: NORVASC Take 5 mg by mouth daily.   aspirin EC 81 MG tablet Take 81 mg by mouth at bedtime.   atorvastatin 10 MG tablet Commonly known as: LIPITOR Take 10 mg by mouth at bedtime.   colchicine 0.6 MG tablet Take 0.6 mg by mouth daily as needed (pain).   cyanocobalamin 1000 MCG tablet Take 1,000 mcg by mouth daily.   cyclobenzaprine 10 MG tablet Commonly known as: FLEXERIL Take 10 mg by mouth 3 (three) times daily as needed for muscle spasms.   docusate sodium 100 MG capsule Commonly known as: COLACE Take 100 mg by mouth daily.    famotidine 20 MG tablet Commonly known as: PEPCID Take 20 mg by mouth 2 (two) times daily as needed for heartburn.   fluticasone 50 MCG/ACT nasal spray Commonly known as: FLONASE Place 1 spray into both nostrils 2 (two) times daily as needed for allergies or rhinitis (congestion).   gabapentin 600 MG tablet Commonly known as: NEURONTIN Take 600 mg by mouth 3 (three) times daily.   guaiFENesin 600 MG 12 hr tablet Commonly known as: MUCINEX Take 600 mg by mouth daily as needed for cough or to loosen phlegm (allergies).   hydrochlorothiazide 25 MG tablet Commonly known as: HYDRODIURIL Take 25 mg by mouth daily.   lisinopril 20 MG tablet Commonly known as: ZESTRIL   omega-3 acid ethyl esters 1 g capsule Commonly known as: LOVAZA Take 2 g by mouth 2 (two) times daily.   PROBIOTIC PO Take 1 tablet by mouth daily.        Allergies:  Allergies  Allergen Reactions   Codeine Nausea And Vomiting    Family History: Family History  Problem Relation Age of Onset   Ovarian cancer Mother    Stroke Father    Hypertension Sister    Heart disease Maternal Grandmother    Heart disease Maternal Grandfather    Heart disease Paternal Grandmother    Diabetes Paternal Grandfather    Breast cancer Neg Hx     Social History:  reports that she has never smoked. She has never used smokeless tobacco. She reports that she does not drink alcohol and does not use drugs.   Physical Exam: There were no vitals taken for this visit.  Constitutional:  Alert and oriented, No acute distress. HEENT: [place] AT, moist mucus membranes.  Trachea midline, no masses. Cardiovascular: No clubbing, cyanosis, or edema. Respiratory: Normal respiratory effort, no increased work of breathing. Skin: No rashes, bruises or suspicious lesions. Neurologic: Grossly intact, no focal deficits, moving all 4 extremities. Psychiatric: Normal mood and affect.  Laboratory Data: Lab Results  Component Value Date    CREATININE 1.06 (H) 04/11/2018   Urinalysis   Pertinent Imaging: CLINICAL DATA:  Chronic renal disease   EXAM: RENAL / URINARY TRACT ULTRASOUND COMPLETE   COMPARISON:  02/28/2011   FINDINGS: Right Kidney:   Renal measurements: 10.2 x 5.1 x 5.3 cm. = volume: 145 mL. Cystic lesions are noted the largest of which lies in the midportion of the right kidney measuring 3 cm in greatest dimension. No mass lesion or hydronephrosis is noted.   Left Kidney:   Renal measurements: 11.1 x 4.8 x 4.6 cm. = volume: 127 mL. No mass lesion or hydronephrosis is noted. 8  mm cyst is noted in the lower pole. There is a 2.4 cm solid appearing lesion arising from the upper pole.   Bladder:   Appears normal for degree of bladder distention.   Other:   None.   IMPRESSION: Bilateral renal cysts.   2.4 cm solid left kidney lesion. This is suspicious for underlying neoplasm. MRI of the kidneys is recommended for further evaluation.   These results will be called to the ordering clinician or representative by the Radiologist Assistant, and communication documented in the PACS or [employer].     Electronically Signed   By: Inez Catalina M.D.   On: 09/18/2021 23:37  CLINICAL DATA:  Evaluate left kidney lesion   EXAM: MRI ABDOMEN WITHOUT AND WITH CONTRAST   TECHNIQUE: Multiplanar multisequence MR imaging of the abdomen was performed both before and after the administration of intravenous contrast.   CONTRAST:  34m GADAVIST GADOBUTROL 1 MMOL/ML IV SOLN   COMPARISON:  U/S renal 09/18/2021   FINDINGS: Lower chest: No acute findings.   Hepatobiliary: Hepatic steatosis noted. Simple appearing cyst in lateral segment of left lobe of liver measures 1.1 cm, image 6/9. No suspicious enhancing lesions identified within the imaged portions of the liver. Status post cholecystectomy.   Pancreas: No mass, inflammatory changes, or other parenchymal abnormality identified.   Spleen:   Within normal limits in size and appearance.   Adrenals/Urinary Tract: The adrenal glands are both normal. Arising off the lateral cortex of the upper pole of the left kidney is a solid enhancing lesion measuring 2.1 x 2.1 by 1.7 cm, image 27/13 and image 13/4. Bilateral kidney cysts are also identified. The largest arises from the inferior pole of right kidney measuring 2.8 x 2.8 cm, image 25/4. Small hemorrhagic cyst arising off the upper pole of the left kidney measures 0.9 cm, image 12/4.   Stomach/Bowel: Stomach appears normal. Duodenal diverticulum. No dilated loops of bowel.   Vascular/Lymphatic: Aortic atherosclerosis without aneurysm. No adenopathy identified within the imaged portions of the upper abdomen.   Other: Previous ventral abdominal wall herniorrhaphy is identified at the approximate location of the umbilicus. No signs of free fluid or fluid collections. Presumed uterine fibroid is partially visualized within the right side of uterus and appears similar to remote CT from 04/18/2017.   Musculoskeletal: No suspicious enhancing bone lesions.   IMPRESSION: 1. 2.1 cm solid enhancing lesion arising off the lateral cortex of the upper pole of the left kidney compatible with renal cell carcinoma. 2. No signs of nodal or solid organ metastasis within the imaged portions of the abdomen. The left renal vein appears patent. 3. Bilateral kidney cysts. 4. Hepatic steatosis.     Electronically Signed   By: TKerby MoorsM.D.   On: 10/13/2021 13:14   MRI was personally viewed and compared to her previous imaging studies as outlined above in HPI.   Assessment & Plan:    1. Left Renal mass 2.1 cm he has a left solid renal mass  A solid renal mass raises the suspicion of primary renal malignancy.  We discussed this in detail and in regards to the spectrum of renal masses which includes cysts (pure cysts are considered benign), solid masses and everything in between.  The risk of metastasis increases as the size of solid renal mass increases. In general, it is believed that the risk of metastasis for renal masses less than 3-4 cm is small (up to approximately 5%) based mainly on large retrospective studies. In some  cases and especially in patients of older age and multiple comorbidities a surveillance approach may be appropriate. The treatment of solid renal masses includes: surveillance, cryoablation (percutaneous and laparoscopic) in addition to partial and complete nephrectomy (each with option of laparoscopic, robotic and open depending on appropriateness). Furthermore, nephrectomy appears to be an independent risk factor for the development of chronic kidney disease suggesting that nephron sparing approaches should be implored whenever feasible. We reviewed these options in context of the patients current situation as well as the pros and cons of each.  Based on previous imaging, this mass has been in place and has less than about a 2 mm annual growth rate.  As such, I most strongly recommend consideration of active surveillance of this lesion and continued interval imaging to assess that this small lesion remains on the same trajectory as in the past.  Therapy with or without biopsy could also be a consideration however due to close proximity of the spleen, may require hydrodissection or may not be amenable, would defer to interventional radiology to make this assessment.  She was offered a referral today and declined, she may consider in the future if there is significant interval growth. - Urinalysis, Complete  2. Other specified disorders of kidney and ureter - MR Abdomen W Wo Contrast; Future   F/u 6 months MR abd/ pelvis  I,Kailey Littlejohn,acting as a scribe for Hollice Espy, MD.,have documented all relevant documentation on the behalf of Hollice Espy, MD,as directed by  Hollice Espy, MD while in the presence of Hollice Espy, MD.  I have  reviewed the above documentation for accuracy and completeness, and I agree with the above.   Hollice Espy, MD    [facility] [address], [place], [place] [address] [phone]   I spent 46 total minutes on the day of the encounter including pre-visit review of the medical record, face-to-face time with the patient, and post visit ordering of labs/imaging/tests.

## 2021-10-23 ENCOUNTER — Other Ambulatory Visit: Payer: Self-pay

## 2021-10-23 ENCOUNTER — Ambulatory Visit: Payer: Medicare Other | Admitting: Urology

## 2021-10-23 VITALS — BP 145/74 | HR 76 | Ht 61.0 in | Wt 194.0 lb

## 2021-10-23 DIAGNOSIS — N2889 Other specified disorders of kidney and ureter: Secondary | ICD-10-CM | POA: Insufficient documentation

## 2021-10-24 LAB — URINALYSIS, COMPLETE
Bilirubin, UA: NEGATIVE
Ketones, UA: NEGATIVE
Nitrite, UA: NEGATIVE
Protein,UA: NEGATIVE
RBC, UA: NEGATIVE
Specific Gravity, UA: 1.025 (ref 1.005–1.030)
Urobilinogen, Ur: 0.2 mg/dL (ref 0.2–1.0)
pH, UA: 5.5 (ref 5.0–7.5)

## 2021-10-24 LAB — MICROSCOPIC EXAMINATION

## 2022-03-28 ENCOUNTER — Other Ambulatory Visit: Payer: Self-pay | Admitting: Family Medicine

## 2022-03-28 DIAGNOSIS — Z1231 Encounter for screening mammogram for malignant neoplasm of breast: Secondary | ICD-10-CM

## 2022-04-19 ENCOUNTER — Ambulatory Visit
Admission: RE | Admit: 2022-04-19 | Discharge: 2022-04-19 | Disposition: A | Discharge status: Home / Self Care | Payer: Medicare Other | Source: Ambulatory Visit | Attending: Family Medicine | Admitting: Family Medicine

## 2022-04-19 DIAGNOSIS — Z1231 Encounter for screening mammogram for malignant neoplasm of breast: Secondary | ICD-10-CM | POA: Diagnosis present

## 2022-04-19 HISTORY — DX: Malignant neoplasm of unspecified site of unspecified female breast: C50.919

## 2022-04-25 ENCOUNTER — Ambulatory Visit
Admission: RE | Admit: 2022-04-25 | Discharge: 2022-04-25 | Disposition: A | Discharge status: Home / Self Care | Payer: Medicare Other | Source: Ambulatory Visit | Attending: Urology | Admitting: Urology

## 2022-04-25 DIAGNOSIS — N2889 Other specified disorders of kidney and ureter: Secondary | ICD-10-CM | POA: Insufficient documentation

## 2022-04-25 MED ORDER — GADOBUTROL 1 MMOL/ML IV SOLN
9.0000 mL | Freq: Once | INTRAVENOUS | Status: AC | PRN
  Administered 2022-04-25: 9 mL via INTRAVENOUS

## 2022-05-01 ENCOUNTER — Encounter: Payer: Self-pay | Admitting: Urology

## 2022-05-01 ENCOUNTER — Ambulatory Visit: Payer: Medicare Other | Admitting: Urology

## 2022-05-01 VITALS — BP 125/68 | HR 83 | Ht 61.0 in | Wt 194.0 lb

## 2022-05-01 DIAGNOSIS — N2889 Other specified disorders of kidney and ureter: Secondary | ICD-10-CM

## 2022-05-01 NOTE — Progress Notes (Signed)
05/01/2022 2:38 PM   Theresa Dodson [birth year] [id number]  Referring provider: Gayland Curry, MD [address] [place],  [place] [address]  Chief Complaint  Patient presents with   Renal Mass    HPI: 76 year old female who returns today for follow-up of incidental left renal mass.  This discovered on further work-up for CKD.  She had a renal ultrasound which led to an MRI in 09/2021 demonstrating a 2.1 cm solid enhancing renal mass arising off the lateral cortex of the left upper pole.  In retrospect, this may have been present in 2018 on CT abdomen pelvis with contrast at the time measuring 1.4 cm.  Today, she follows up with interval 32-monthMRI.  There is been essentially no change in the size and quality of the mass.  She denies any flank pain or gross hematuria.  She is accompanied today by her sister.   PMH: Past Medical History:  Diagnosis Date   [place]    [place]    skin ca   Chronic renal insufficiency    Difficult intubation    Diverticulitis    Generalized osteoarthritis    GERD (gastroesophageal reflux disease)    Gout    Gout    Hypercholesteremia    Hypertension    Pre-diabetes    Sleep apnea     Surgical History: Past Surgical History:  Procedure Laterality Date   breast benign mass removal  2012   BREAST BIOPSY Right 07/2011   NEG   BREAST BIOPSY Right 11/17/2020   U/ S bx, Q,  marker ANGIOLIPOMA. - NEGATIVE FOR MALIGNANCY.   BREAST BIOPSY Right 04/03/2021   stereo calcs, x marker, path pending   CARPAL TUNNEL RELEASE Bilateral    CATARACT EXTRACTION Right    CHOLECYSTECTOMY     COLONOSCOPY WITH PROPOFOL N/A 06/26/2017   Procedure: COLONOSCOPY WITH PROPOFOL;  Surgeon: SLollie Sails MD;  [facility];  Service: Endoscopy;  Laterality: N/A;   FOOT SURGERY     d/t gout   INCISIONAL HERNIA REPAIR N/A 08/13/2017   Procedure: HERNIA REPAIR INCISIONAL;  Surgeon: WClayburn Pert MD;  Location: [facility] ORS;   Service: General;  Laterality: N/A;   MANDIBLE SURGERY     tooth abscess     UMBILICAL HERNIA REPAIR N/A 08/13/2017   Procedure: HERNIA REPAIR UMBILICAL ADULT;  Surgeon: WClayburn Pert MD;  Location: [facility] ORS;  Service: General;  Laterality: N/A;   WRIST SURGERY      Home Medications:  Allergies as of 05/01/2022       Reactions   Codeine Nausea And Vomiting        Medication List        Accurate as of May 01, 2022  2:38 PM. If you have any questions, ask your nurse or doctor.          acetaminophen 650 MG CR tablet Commonly known as: TYLENOL Take 650 mg by mouth 2 (two) times daily.   allopurinol 300 MG tablet Commonly known as: ZYLOPRIM Take 300 mg by mouth daily.   allopurinol 100 MG tablet Commonly known as: ZYLOPRIM Take 100 mg by mouth at bedtime.   amLODipine 5 MG tablet Commonly known as: NORVASC Take 5 mg by mouth daily.   aspirin EC 81 MG tablet Take 81 mg by mouth at bedtime.   atorvastatin 10 MG tablet Commonly known as: LIPITOR Take 10 mg by mouth at bedtime.   colchicine 0.6 MG tablet Take 0.6 mg by  mouth daily as needed (pain).   cyanocobalamin 1000 MCG tablet Take 1,000 mcg by mouth daily.   cyclobenzaprine 10 MG tablet Commonly known as: FLEXERIL Take 10 mg by mouth 3 (three) times daily as needed for muscle spasms.   docusate sodium 100 MG capsule Commonly known as: COLACE Take 100 mg by mouth daily.   famotidine 20 MG tablet Commonly known as: PEPCID Take 20 mg by mouth 2 (two) times daily as needed for heartburn.   Farxiga 10 MG Tabs tablet Generic drug: dapagliflozin propanediol Take 10 mg by mouth every morning.   fluticasone 50 MCG/ACT nasal spray Commonly known as: FLONASE Place 1 spray into both nostrils 2 (two) times daily as needed for allergies or rhinitis (congestion).   gabapentin 600 MG tablet Commonly known as: NEURONTIN Take 600 mg by mouth 3 (three) times daily.   guaiFENesin 600 MG 12 hr  tablet Commonly known as: MUCINEX Take 600 mg by mouth daily as needed for cough or to loosen phlegm (allergies).   hydrochlorothiazide 25 MG tablet Commonly known as: HYDRODIURIL Take 25 mg by mouth daily.   lisinopril 20 MG tablet Commonly known as: ZESTRIL   omega-3 acid ethyl esters 1 g capsule Commonly known as: LOVAZA Take 2 g by mouth 2 (two) times daily.   PROBIOTIC PO Take 1 tablet by mouth daily.        Allergies:  Allergies  Allergen Reactions   Codeine Nausea And Vomiting    Family History: Family History  Problem Relation Age of Onset   Ovarian cancer Mother    Stroke Father    Hypertension Sister    Heart disease Maternal Grandmother    Heart disease Maternal Grandfather    Heart disease Paternal Grandmother    Diabetes Paternal Grandfather    Breast cancer Neg Hx     Social History:  reports that she has never smoked. She has never used smokeless tobacco. She reports that she does not drink alcohol and does not use drugs.   Physical Exam: BP 125/68   Pulse 83   Ht '5\' 1"'$  (1.549 m)   Wt 194 lb (88 kg)   BMI 36.66 kg/m   Constitutional:  Alert and oriented, No acute distress. HEENT: [place] AT, moist mucus membranes.  Trachea midline, no masses. Neurologic: Grossly intact, no focal deficits, moving all 4 extremities. Psychiatric: Normal mood and affect.   Pertinent Imaging: IMPRESSION: 1. Stable 2.2 cm solid enhancing left upper pole renal mass compatible with renal cell carcinoma. 2. No evidence of tumor in renal vein or abdominal metastatic disease. 3. Additional bilateral renal cysts and renal lesions technically too small to accurately characterize but statistically likely to reflect cysts are considered benign and require no independent imaging follow-up. 4. Hepatic steatosis.     Electronically Signed   By: Dahlia Bailiff M.D.   On: 04/26/2022 07:52  MRI images were personally reviewed.  Agree with radiologic interpretation as  outlined above.  Assessment & Plan:    1. Renal mass Unchanged small left upper pole renal mass compatible with either benign tumor or indolent renal cell carcinoma  Given the overall stability as well as chronicity of this mass, strongly recommend continuation of active surveillance.  She is agreeable this plan.  We will plan for MRI again in a year and if it still stable at that point, may de-escalate imaging modality and increased frequency of follow-up imaging.    - MR Abdomen W Wo Contrast; Future   Follow-up  1 year with MRI  Hollice Espy, MD  [facility] [address] [place], [place] [address] [phone]

## 2022-06-14 ENCOUNTER — Encounter: Payer: Self-pay | Admitting: Emergency Medicine

## 2022-06-14 ENCOUNTER — Ambulatory Visit
Admission: EM | Admit: 2022-06-14 | Discharge: 2022-06-14 | Disposition: A | Discharge status: Home / Self Care | Payer: Medicare Other | Attending: Emergency Medicine | Admitting: Emergency Medicine

## 2022-06-14 DIAGNOSIS — J069 Acute upper respiratory infection, unspecified: Secondary | ICD-10-CM

## 2022-06-14 DIAGNOSIS — Z20822 Contact with and (suspected) exposure to covid-19: Secondary | ICD-10-CM | POA: Diagnosis present

## 2022-06-14 LAB — RESP PANEL BY RT-PCR (FLU A&B, COVID) ARPGX2
Influenza A by PCR: NEGATIVE
Influenza B by PCR: NEGATIVE
SARS Coronavirus 2 by RT PCR: NEGATIVE

## 2022-06-14 MED ORDER — IPRATROPIUM BROMIDE 0.06 % NA SOLN: 2.0000 | Freq: Four times a day (QID) | NASAL | 0 refills | Status: DC

## 2022-06-14 MED ORDER — PROMETHAZINE-DM 6.25-15 MG/5ML PO SYRP: 5.0000 mL | ORAL_SOLUTION | Freq: Four times a day (QID) | ORAL | 0 refills | Status: DC | PRN

## 2022-06-14 MED ORDER — BENZONATATE 200 MG PO CAPS: 200.0000 mg | ORAL_CAPSULE | Freq: Three times a day (TID) | ORAL | 0 refills | Status: DC | PRN

## 2022-06-14 NOTE — ED Triage Notes (Signed)
Patient c/o cough, runny nose, sinus drainage, and HAs that started 4 days ago.  Patient denies fevers.

## 2022-06-14 NOTE — Discharge Instructions (Addendum)
Your COVID, influenza are negative.  Take the Atrovent nasal spray for her nasal congestion and postnasal drip, Mucinex, Tessalon for the cough during the day, Promethazine DM for the cough at night.  Saline nasal irrigation with a NeilMed sinus rinse, distilled water as often as you want, Tylenol 1000 mg 3 times a day as needed for pain.  I hope that you feel better soon.

## 2022-06-14 NOTE — ED Provider Notes (Signed)
HPI  SUBJECTIVE:  Theresa Dodson is a 76 y.o. female who presents with 4 days of nasal congestion, white rhinorrhea, postnasal drip, cough productive of phlegm, headaches.  No fevers, body aches, sinus pain or pressure, sore throat, loss of sense of smell or taste, wheezing, shortness of breath, nausea, vomiting, diarrhea, abdominal pain.  She states that her allergies are bothering her somewhat.  No known COVID or flu exposure.  She got 4 doses of the COVID-vaccine and this years flu vaccine.  She is unable to sleep at night because of the cough.  No antibiotics in the past month.  She took Tylenol within 6 hours of evaluation.  She has also tried Mucinex DM, DayQuil, Robitussin-DM and herbal tea.  No aggravating or alleviating factors.  She has a past medical history of diabetes, chronic kidney disease stage III, OSA on CPAP, hypertension and allergies.  No history of pulmonary disease, COVID.  PCP: [facility].   Past Medical History:  Diagnosis Date   Breast cancer ([place])    Cancer ([place])    skin ca   Chronic renal insufficiency    Difficult intubation    Diverticulitis    Generalized osteoarthritis    GERD (gastroesophageal reflux disease)    Gout    Gout    Hypercholesteremia    Hypertension    Pre-diabetes    Sleep apnea     Past Surgical History:  Procedure Laterality Date   breast benign mass removal  2012   BREAST BIOPSY Right 07/2011   NEG   BREAST BIOPSY Right 11/17/2020   U/ S bx, Q,  marker ANGIOLIPOMA. - NEGATIVE FOR MALIGNANCY.   BREAST BIOPSY Right 04/03/2021   stereo calcs, x marker, path pending   CARPAL TUNNEL RELEASE Bilateral    CATARACT EXTRACTION Right    CHOLECYSTECTOMY     COLONOSCOPY WITH PROPOFOL N/A 06/26/2017   Procedure: COLONOSCOPY WITH PROPOFOL;  Surgeon: Lollie Sails, MD;  Location: [facility] ENDOSCOPY;  Service: Endoscopy;  Laterality: N/A;   FOOT SURGERY     d/t gout   INCISIONAL HERNIA REPAIR N/A 08/13/2017   Procedure: HERNIA REPAIR  INCISIONAL;  Surgeon: Clayburn Pert, MD;  Location: [facility] ORS;  Service: General;  Laterality: N/A;   MANDIBLE SURGERY     tooth abscess     UMBILICAL HERNIA REPAIR N/A 08/13/2017   Procedure: HERNIA REPAIR UMBILICAL ADULT;  Surgeon: Clayburn Pert, MD;  Location: [facility] ORS;  Service: General;  Laterality: N/A;   WRIST SURGERY      Family History  Problem Relation Age of Onset   Ovarian cancer Mother    Stroke Father    Hypertension Sister    Heart disease Maternal Grandmother    Heart disease Maternal Grandfather    Heart disease Paternal Grandmother    Diabetes Paternal Grandfather    Breast cancer Neg Hx     Social History   Tobacco Use   Smoking status: Never   Smokeless tobacco: Never  Vaping Use   Vaping Use: Never used  Substance Use Topics   Alcohol use: No   Drug use: No    No current facility-administered medications for this encounter.  Current Outpatient Medications:    allopurinol (ZYLOPRIM) 300 MG tablet, Take 300 mg by mouth daily., Disp: , Rfl:    amLODipine (NORVASC) 5 MG tablet, Take 5 mg by mouth daily., Disp: , Rfl:    aspirin EC 81 MG tablet, Take 81 mg by mouth at bedtime. , Disp: ,  Rfl:    atorvastatin (LIPITOR) 10 MG tablet, Take 10 mg by mouth at bedtime. , Disp: , Rfl:    benzonatate (TESSALON) 200 MG capsule, Take 1 capsule (200 mg total) by mouth 3 (three) times daily as needed for cough., Disp: 30 capsule, Rfl: 0   colchicine 0.6 MG tablet, Take 0.6 mg by mouth daily as needed (pain). , Disp: , Rfl:    cyanocobalamin 1000 MCG tablet, Take 1,000 mcg by mouth daily. , Disp: , Rfl:    famotidine (PEPCID) 20 MG tablet, Take 20 mg by mouth 2 (two) times daily as needed for heartburn. , Disp: , Rfl:    FARXIGA 10 MG TABS tablet, Take 10 mg by mouth every morning., Disp: , Rfl:    gabapentin (NEURONTIN) 600 MG tablet, Take 600 mg by mouth 3 (three) times daily., Disp: , Rfl:    ipratropium (ATROVENT) 0.06 % nasal spray, Place 2 sprays into both  nostrils 4 (four) times daily., Disp: 15 mL, Rfl: 0   lisinopril (PRINIVIL,ZESTRIL) 20 MG tablet, , Disp: , Rfl: 3   omega-3 acid ethyl esters (LOVAZA) 1 G capsule, Take 2 g by mouth 2 (two) times daily. , Disp: , Rfl:    promethazine-dextromethorphan (PROMETHAZINE-DM) 6.25-15 MG/5ML syrup, Take 5 mLs by mouth 4 (four) times daily as needed for cough., Disp: 118 mL, Rfl: 0   acetaminophen (TYLENOL) 650 MG CR tablet, Take 650 mg by mouth 2 (two) times daily., Disp: , Rfl:    allopurinol (ZYLOPRIM) 100 MG tablet, Take 100 mg by mouth at bedtime. , Disp: , Rfl:    docusate sodium (COLACE) 100 MG capsule, Take 100 mg by mouth daily., Disp: , Rfl:    guaiFENesin (MUCINEX) 600 MG 12 hr tablet, Take 600 mg by mouth daily as needed for cough or to loosen phlegm (allergies)., Disp: , Rfl:    hydrochlorothiazide (HYDRODIURIL) 25 MG tablet, Take 25 mg by mouth daily., Disp: , Rfl:    Probiotic Product (PROBIOTIC PO), Take 1 tablet by mouth daily., Disp: , Rfl:   Allergies  Allergen Reactions   Codeine Nausea And Vomiting     ROS  As noted in HPI.   Physical Exam  BP (!) 142/83 (BP Location: Left Arm)   Pulse 76   Temp 98.6 F (37 C) (Oral)   Resp 14   Ht '5\' 1"'$  (1.549 m)   Wt 98.4 kg   SpO2 96%   BMI 41.00 kg/m   Constitutional: Well developed, well nourished, no acute distress.  Coughing Eyes:  EOMI, conjunctiva normal bilaterally HENT: Normocephalic, atraumatic,mucus membranes moist.  Clear nasal congestion.  Normal turbinates.  Very mild right maxillary sinus tenderness.  No frontal sinus tenderness.  Normal oropharynx. Neck: Positive cervical lymphadenopathy Respiratory: Normal inspiratory effort, lungs clear bilaterally.  Positive lateral chest wall tenderness Cardiovascular: Normal rate and regular rhythm, no murmurs, rubs, gallops GI: nondistended skin: No rash, skin intact Musculoskeletal: no deformities Neurologic: Alert & oriented x 3, no focal neuro deficits Psychiatric:  Speech and behavior appropriate   ED Course   Medications - No data to display  Orders Placed This Encounter  Procedures   Resp Panel by RT-PCR (Flu A&B, Covid) Anterior Nasal Swab    Standing Status:   Standing    Number of Occurrences:   1   Airborne and Contact precautions    Standing Status:   Standing    Number of Occurrences:   1    Results for orders placed or  performed during the hospital encounter of 06/14/22 (from the past 24 hour(s))  Resp Panel by RT-PCR (Flu A&B, Covid) Anterior Nasal Swab     Status: None   Collection Time: 06/14/22  8:21 AM   Specimen: Anterior Nasal Swab  Result Value Ref Range   SARS Coronavirus 2 by RT PCR NEGATIVE NEGATIVE   Influenza A by PCR NEGATIVE NEGATIVE   Influenza B by PCR NEGATIVE NEGATIVE   No results found.  ED Clinical Impression  1. Upper respiratory tract infection, unspecified type   2. Encounter for laboratory testing for COVID-19 virus      ED Assessment/Plan     Outside labs reviewed. Last creatinine clearance from outside labs done in July 2023 68 mL/min. . Presentation consistent with URI versus allergies.  SPECT URI with a cervical lymphadenopathy.  COVID, influenza testing negative.  Will try Mucinex, Atrovent nasal spray 2 sprays in each nostril 3-4 times a day, saline nasal irrigation, Tessalon perles, promethazine DM 5 mL every 6 hours as needed cough.  If the Mucinex does not help, she is to switch to an allergy medication.  Discussed with patient that there is a lot of overlap between allergies and URIs.   Discussed labs,  MDM, treatment plan, and plan for follow-up with patient. Discussed sn/sx that should prompt return to the ED. patient agrees with plan.   Meds ordered this encounter  Medications   benzonatate (TESSALON) 200 MG capsule    Sig: Take 1 capsule (200 mg total) by mouth 3 (three) times daily as needed for cough.    Dispense:  30 capsule    Refill:  0   ipratropium (ATROVENT) 0.06 %  nasal spray    Sig: Place 2 sprays into both nostrils 4 (four) times daily.    Dispense:  15 mL    Refill:  0   promethazine-dextromethorphan (PROMETHAZINE-DM) 6.25-15 MG/5ML syrup    Sig: Take 5 mLs by mouth 4 (four) times daily as needed for cough.    Dispense:  118 mL    Refill:  0      *This clinic note was created using [employer]. Therefore, there may be occasional mistakes despite careful proofreading.  ?    Melynda Ripple, MD 06/14/22 [phone]

## 2023-01-16 ENCOUNTER — Other Ambulatory Visit: Payer: Self-pay

## 2023-01-16 ENCOUNTER — Emergency Department: Payer: Medicare Other

## 2023-01-16 ENCOUNTER — Emergency Department
Admission: EM | Admit: 2023-01-16 | Discharge: 2023-01-16 | Disposition: A | Discharge status: Home / Self Care | Payer: Medicare Other | Attending: Emergency Medicine | Admitting: Emergency Medicine

## 2023-01-16 DIAGNOSIS — S50312A Abrasion of left elbow, initial encounter: Secondary | ICD-10-CM | POA: Diagnosis not present

## 2023-01-16 DIAGNOSIS — Y93H2 Activity, gardening and landscaping: Secondary | ICD-10-CM | POA: Diagnosis not present

## 2023-01-16 DIAGNOSIS — R55 Syncope and collapse: Secondary | ICD-10-CM | POA: Diagnosis not present

## 2023-01-16 DIAGNOSIS — W0110XA Fall on same level from slipping, tripping and stumbling with subsequent striking against unspecified object, initial encounter: Secondary | ICD-10-CM | POA: Insufficient documentation

## 2023-01-16 DIAGNOSIS — S7002XA Contusion of left hip, initial encounter: Secondary | ICD-10-CM | POA: Insufficient documentation

## 2023-01-16 DIAGNOSIS — M25552 Pain in left hip: Secondary | ICD-10-CM | POA: Diagnosis present

## 2023-01-16 DIAGNOSIS — W19XXXA Unspecified fall, initial encounter: Secondary | ICD-10-CM

## 2023-01-16 LAB — URINALYSIS, ROUTINE W REFLEX MICROSCOPIC
Bacteria, UA: NONE SEEN
Bilirubin Urine: NEGATIVE
Glucose, UA: >=500 mg/dL — AB
Hgb urine dipstick: NEGATIVE
Ketones, ur: 5 mg/dL — AB
Leukocytes,Ua: NEGATIVE
Nitrite: NEGATIVE
Protein, ur: NEGATIVE mg/dL
Specific Gravity, Urine: 1.018 (ref 1.005–1.030)
pH: 5 (ref 5.0–8.0)

## 2023-01-16 LAB — BASIC METABOLIC PANEL
Anion gap: 10 (ref 5–15)
BUN: 28 mg/dL — ABNORMAL HIGH (ref 8–23)
CO2: 24 mmol/L (ref 22–32)
Calcium: 9.8 mg/dL (ref 8.9–10.3)
Chloride: 106 mmol/L (ref 98–111)
Creatinine, Ser: 1.36 mg/dL — ABNORMAL HIGH (ref 0.44–1.00)
GFR, Estimated: 40 mL/min — ABNORMAL LOW (ref >60)
Glucose, Bld: 110 mg/dL — ABNORMAL HIGH (ref 70–99)
Potassium: 4.9 mmol/L (ref 3.5–5.1)
Sodium: 140 mmol/L (ref 135–145)

## 2023-01-16 LAB — CBC
HCT: 47.2 % — ABNORMAL HIGH (ref 36.0–46.0)
Hemoglobin: 14.8 g/dL (ref 12.0–15.0)
MCH: 29.6 pg (ref 26.0–34.0)
MCHC: 31.4 g/dL (ref 30.0–36.0)
MCV: 94.4 fL (ref 80.0–100.0)
Platelets: 224 10*3/uL (ref 150–400)
RBC: 5 MIL/uL (ref 3.87–5.11)
RDW: 15.8 % — ABNORMAL HIGH (ref 11.5–15.5)
WBC: 5.6 10*3/uL (ref 4.0–10.5)
nRBC: 0 % (ref 0.0–0.2)

## 2023-01-16 LAB — TROPONIN I (HIGH SENSITIVITY): Troponin I (High Sensitivity): 4 ng/L (ref <18)

## 2023-01-16 NOTE — ED Notes (Signed)
Patient transported return from X-ray

## 2023-01-16 NOTE — ED Notes (Signed)
Pt verbalizes understanding of discharge instructions. Opportunity for questioning and answers were provided. Pt discharged from ED to home with family.    

## 2023-01-16 NOTE — ED Triage Notes (Signed)
Pt here with a fall today. Pt tripped while gardening and hit her head and passed out. Pt also c/o left hip pain. Pt has a abrasion to her left elbow. Pt talking in complete sentences. Pt is not on a blood thinner.

## 2023-01-16 NOTE — Discharge Instructions (Addendum)
Please seek medical attention for any high fevers, chest pain, shortness of breath, change in behavior, persistent vomiting, bloody stool or any other new or concerning symptoms.  

## 2023-01-16 NOTE — ED Provider Notes (Signed)
[facility] Provider Note    Event Date/Time   First MD Initiated Contact with Patient 01/16/23 1718     (approximate)   History   Fall   HPI  Theresa Dodson is a 77 y.o. female  who presents to the emergency department today because of concern for pain after a fall.  The patient states that she was gardening when she fell backwards.  States it was mechanical fall.  Did land on her left side and is having pain in her left hip and mild pain in her left elbow. Had abrasion to her left elbow. Is unsure if she hit her head but does think she briefly blacked out after she fell. She is not having any headache. No spine pain.    Physical Exam   Triage Vital Signs: ED Triage Vitals  Enc Vitals Group     BP 01/16/23 1457 (!) 158/67     Pulse Rate 01/16/23 1457 74     Resp 01/16/23 1457 16     Temp 01/16/23 1457 98.3 F (36.8 C)     Temp Source 01/16/23 1457 Oral     SpO2 01/16/23 1457 97 %     Weight 01/16/23 1457 205 lb (93 kg)     Height 01/16/23 1457 5\' 2"  (1.575 m)     Head Circumference --      Peak Dodson --      Pain Score 01/16/23 1504 4     Pain Loc --      Pain Edu? --      Excl. in GC? --     Most recent vital signs: Vitals:   01/16/23 1457  BP: (!) 158/67  Pulse: 74  Resp: 16  Temp: 98.3 F (36.8 C)  SpO2: 97%   General: Awake, alert, oriented. CV:  Good peripheral perfusion. Regular rate and rhythm. Resp:  Normal effort. Lungs clear. Abd:  No distention.     ED Results / Procedures / Treatments   Labs (all labs ordered are listed, but only abnormal results are displayed) Labs Reviewed  BASIC METABOLIC PANEL - Abnormal; Notable for the following components:      Result Value   Glucose, Bld 110 (*)    BUN 28 (*)    Creatinine, Ser 1.36 (*)    GFR, Estimated 40 (*)    All other components within normal limits  CBC - Abnormal; Notable for the following components:   HCT 47.2 (*)    RDW 15.8 (*)    All other components  within normal limits  URINALYSIS, ROUTINE W REFLEX MICROSCOPIC - Abnormal; Notable for the following components:   Color, Urine YELLOW (*)    APPearance CLEAR (*)    Glucose, UA >=500 (*)    Ketones, ur 5 (*)    All other components within normal limits  CBG MONITORING, ED     EKG  I, Phineas Semen, attending physician, personally viewed and interpreted this EKG  EKG Time: 1501 Rate: 78 Rhythm: sinus rhythm with PVC Axis: normal Intervals: qtc 408 QRS: narrow, low voltage ST changes: no st elevation Impression: abnormal ekg   RADIOLOGY I independently interpreted and visualized the CT head. My interpretation: no bleed Radiology interpretation:  IMPRESSION:  1. No evidence of acute intracranial injury.  2. 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    I independently interpreted and visualized the left hip. My interpretation: no fracture/dislocation Radiology interpretation:  IMPRESSION:  No recent fracture or dislocation is  seen in left hip.      PROCEDURES:  Critical Care performed: No   MEDICATIONS ORDERED IN ED: Medications - No data to display   IMPRESSION / MDM / ASSESSMENT AND PLAN / ED COURSE  I reviewed the triage vital signs and the nursing notes.                              Differential diagnosis includes, but is not limited to, fracture, dislocation, contusion, head injury.  Patient's presentation is most consistent with acute presentation with potential threat to life or bodily function.   The patient is on the cardiac monitor to evaluate for evidence of arrhythmia and/or significant heart rate changes.  Patient presented to the emergency department today after mechanical fall.  She states that she might have blacked out.  The patient also has complaint of left hip pain.  Does have an abrasion to her left elbow although full range of motion without any significant tenderness.  Head CT without concerning traumatic findings.   Left hip without fracture or dislocation.  Blood work without concerning anemia, troponin elevation or electrolyte abnormality.  At this time I think it is reasonable for patient to be discharged home. Discussed findings with patient.    FINAL CLINICAL IMPRESSION(S) / ED DIAGNOSES   Final diagnoses:  Fall, initial encounter  Contusion of left hip, initial encounter     Note:  This document was prepared using Dragon voice recognition software and may include unintentional dictation errors.    Phineas Semen, MD 01/16/23 Theresa Dodson

## 2023-01-16 NOTE — ED Notes (Signed)
Pt placed on cardiac monitor at this time. Pt alert and oriented. Family at bedside. Call bell within reach.

## 2023-02-17 ENCOUNTER — Other Ambulatory Visit: Payer: Self-pay | Admitting: Internal Medicine

## 2023-02-17 ENCOUNTER — Ambulatory Visit
Admission: RE | Admit: 2023-02-17 | Discharge: 2023-02-17 | Disposition: A | Discharge status: Home / Self Care | Payer: Medicare Other | Source: Ambulatory Visit | Attending: Internal Medicine | Admitting: Internal Medicine

## 2023-02-17 DIAGNOSIS — E785 Hyperlipidemia, unspecified: Secondary | ICD-10-CM | POA: Insufficient documentation

## 2023-04-14 ENCOUNTER — Encounter: Payer: Self-pay | Admitting: Urology

## 2023-04-24 ENCOUNTER — Ambulatory Visit
Admission: RE | Admit: 2023-04-24 | Discharge: 2023-04-24 | Disposition: A | Discharge status: Home / Self Care | Payer: Medicare Other | Source: Ambulatory Visit | Attending: Urology | Admitting: Urology

## 2023-04-24 ENCOUNTER — Telehealth: Payer: Self-pay | Admitting: Urology

## 2023-04-24 ENCOUNTER — Other Ambulatory Visit: Payer: Medicare Other

## 2023-04-24 DIAGNOSIS — N2889 Other specified disorders of kidney and ureter: Secondary | ICD-10-CM

## 2023-04-24 MED ORDER — GADOPICLENOL 0.5 MMOL/ML IV SOLN: 9.0000 mL | Freq: Once | INTRAVENOUS | Status: DC | PRN

## 2023-04-24 NOTE — Telephone Encounter (Signed)
Patient called and said she was not able to have MRI done. She said she had a panic attack when she tried, and had to stop. She is asking what to do now? She has an appointment scheduled with Dr. Apolinar Junes on 04/30/23. Should she keep? Is there something else she can do besides an MRI? Please advise patient.

## 2023-04-24 NOTE — Telephone Encounter (Signed)
Spoke with patient. She is willing to try medication to help her with anxiety for MRI if she is not able to get a different scan done.

## 2023-04-25 MED ORDER — DIAZEPAM 10 MG PO TABS: 10.0000 mg | ORAL_TABLET | Freq: Once | ORAL | 0 refills | Status: AC

## 2023-04-25 NOTE — Telephone Encounter (Signed)
Patient advised. Patient transferred to scheduling for MRI scan and advised patient to call us back to reschedule her office visit to f/u on results.

## 2023-04-25 NOTE — Telephone Encounter (Signed)
Prescription for Valium sent to pharmacy.  Must have driver.

## 2023-04-30 ENCOUNTER — Ambulatory Visit: Payer: Medicare Other | Admitting: Urology

## 2023-05-01 ENCOUNTER — Ambulatory Visit
Admission: RE | Admit: 2023-05-01 | Discharge: 2023-05-01 | Disposition: A | Discharge status: Home / Self Care | Payer: Medicare Other | Source: Ambulatory Visit | Attending: Urology | Admitting: Urology

## 2023-05-01 DIAGNOSIS — N2889 Other specified disorders of kidney and ureter: Secondary | ICD-10-CM | POA: Diagnosis present

## 2023-05-01 MED ORDER — GADOBUTROL 1 MMOL/ML IV SOLN
10.0000 mL | Freq: Once | INTRAVENOUS | Status: AC | PRN
  Administered 2023-05-01: 10 mL via INTRAVENOUS

## 2023-05-20 ENCOUNTER — Ambulatory Visit: Payer: Medicare Other | Admitting: Urology

## 2023-05-20 ENCOUNTER — Encounter: Payer: Self-pay | Admitting: Urology

## 2023-05-20 VITALS — BP 150/77 | HR 80 | Ht 61.0 in | Wt 207.0 lb

## 2023-05-20 DIAGNOSIS — N2889 Other specified disorders of kidney and ureter: Secondary | ICD-10-CM | POA: Diagnosis not present

## 2023-05-20 NOTE — Progress Notes (Signed)
I,Amy L Pierron,acting as a scribe for Vanna Scotland, MD.,have documented all relevant documentation on the behalf of Vanna Scotland, MD,as directed by  Vanna Scotland, MD while in the presence of Vanna Scotland, MD.  05/20/2023 4:43 PM   Theresa Dodson 1946/02/24 [id number]  Referring provider: Leim Fabry, MD [address] [place],  [place] [address]  Chief Complaint  Patient presents with   Other    HPI: 77 year-old female with a personal history of CKD and a 2.1 cm enhancing left upper pole renal mass presents today for annual follow-up with MRI.   The renal mass was seen on CT back in 2017 at which time measured 1.4 cm. She been on surveillance since then.   She underwent MRI on 05/11/2023. It shows a very minimal enlarging renal mass now measuring 2.6 by 2.4 (previously 2.3 by 2.2). It's posteriorly located in closer proximity to the spleen.   She is doing well overall with no new symptoms or complaints.  PMH: Past Medical History:  Diagnosis Date   Breast cancer ([facility])    Cancer ([facility])    skin ca   Chronic renal insufficiency    Difficult intubation    Diverticulitis    Generalized osteoarthritis    GERD (gastroesophageal reflux disease)    Gout    Gout    Hypercholesteremia    Hypertension    Pre-diabetes    Sleep apnea     Surgical History: Past Surgical History:  Procedure Laterality Date   breast benign mass removal  2012   BREAST BIOPSY Right 07/2011   NEG   BREAST BIOPSY Right 11/17/2020   U/ S bx, Q,  marker ANGIOLIPOMA. - NEGATIVE FOR MALIGNANCY.   BREAST BIOPSY Right 04/03/2021   stereo calcs, x marker, path pending   CARPAL TUNNEL RELEASE Bilateral    CATARACT EXTRACTION Right    CHOLECYSTECTOMY     COLONOSCOPY WITH PROPOFOL N/A 06/26/2017   Procedure: COLONOSCOPY WITH PROPOFOL;  Surgeon: Christena Deem, MD;  Location[facility] ENDOSCOPY;  Service: Endoscopy;  Laterality: N/A;   FOOT SURGERY     d/t gout   INCISIONAL HERNIA REPAIR  N/A 08/13/2017   Procedure: HERNIA REPAIR INCISIONAL;  Surgeon: Ricarda Frame, MD;  Location: [facility] ORS;  Service: General;  Laterality: N/A;   MANDIBLE SURGERY     tooth abscess     UMBILICAL HERNIA REPAIR N/A 08/13/2017   Procedure: HERNIA REPAIR UMBILICAL ADULT;  Surgeon: Ricarda Frame, MD;  Location: [facility] ORS;  Service: General;  Laterality: N/A;   WRIST SURGERY      Home Medications:  Allergies as of 05/20/2023       Reactions   Codeine Nausea And Vomiting        Medication List        Accurate as of May 20, 2023  4:43 PM. If you have any questions, ask your nurse or doctor.          acetaminophen 650 MG CR tablet Commonly known as: TYLENOL Take 650 mg by mouth 2 (two) times daily.   allopurinol 300 MG tablet Commonly known as: ZYLOPRIM Take 300 mg by mouth daily.   allopurinol 100 MG tablet Commonly known as: ZYLOPRIM Take 100 mg by mouth at bedtime.   amLODipine 5 MG tablet Commonly known as: NORVASC Take 5 mg by mouth daily.   aspirin EC 81 MG tablet Take 81 mg by mouth at bedtime.   atorvastatin 10 MG tablet Commonly known as: LIPITOR Take  10 mg by mouth at bedtime.   benzonatate 200 MG capsule Commonly known as: TESSALON Take 1 capsule (200 mg total) by mouth 3 (three) times daily as needed for cough.   colchicine 0.6 MG tablet Take 0.6 mg by mouth daily as needed (pain).   cyanocobalamin 1000 MCG tablet Take 1,000 mcg by mouth daily.   docusate sodium 100 MG capsule Commonly known as: COLACE Take 100 mg by mouth daily.   famotidine 20 MG tablet Commonly known as: PEPCID Take 20 mg by mouth 2 (two) times daily as needed for heartburn.   Farxiga 10 MG Tabs tablet Generic drug: dapagliflozin propanediol Take 10 mg by mouth every morning.   gabapentin 600 MG tablet Commonly known as: NEURONTIN Take 600 mg by mouth 3 (three) times daily.   guaiFENesin 600 MG 12 hr tablet Commonly known as: MUCINEX Take 600 mg by mouth daily  as needed for cough or to loosen phlegm (allergies).   hydrochlorothiazide 25 MG tablet Commonly known as: HYDRODIURIL Take 25 mg by mouth daily.   ipratropium 0.06 % nasal spray Commonly known as: ATROVENT Place 2 sprays into both nostrils 4 (four) times daily.   lisinopril 20 MG tablet Commonly known as: ZESTRIL   omega-3 acid ethyl esters 1 g capsule Commonly known as: LOVAZA Take 2 g by mouth 2 (two) times daily.   PROBIOTIC PO Take 1 tablet by mouth daily.   promethazine-dextromethorphan 6.25-15 MG/5ML syrup Commonly known as: PROMETHAZINE-DM Take 5 mLs by mouth 4 (four) times daily as needed for cough.        Allergies:  Allergies  Allergen Reactions   Codeine Nausea And Vomiting    Family History: Family History  Problem Relation Age of Onset   Ovarian cancer Mother    Stroke Father    Hypertension Sister    Heart disease Maternal Grandmother    Heart disease Maternal Grandfather    Heart disease Paternal Grandmother    Diabetes Paternal Grandfather    Breast cancer Neg Hx     Social History:  reports that she has never smoked. She has never used smokeless tobacco. She reports that she does not drink alcohol and does not use drugs.   Physical Exam: BP (!) 150/77   Pulse 80   Ht 5\' 1"  (1.549 m)   Wt 207 lb (93.9 kg)   BMI 39.11 kg/m   Constitutional:  Alert and oriented, No acute distress. HEENT: [place] AT, moist mucus membranes.  Trachea midline, no masses. Neurologic: Grossly intact, no focal deficits, moving all 4 extremities. Psychiatric: Normal mood and affect.   Pertinent Imaging: Narrative & Impression  CLINICAL DATA:  Follow-up renal mass   EXAM: MRI ABDOMEN WITHOUT AND WITH CONTRAST   TECHNIQUE: Multiplanar multisequence MR imaging of the abdomen was performed both before and after the administration of intravenous contrast.   CONTRAST:  10mL GADAVIST GADOBUTROL 1 MMOL/ML IV SOLN   COMPARISON:  04/25/2022   FINDINGS: Lower  chest: No acute abnormality.   Hepatobiliary: No focal liver abnormality is seen. Status post cholecystectomy. No biliary dilatation.   Pancreas: Unremarkable. No pancreatic ductal dilatation or surrounding inflammatory changes.   Spleen: Normal in size without significant abnormality.   Adrenals/Urinary Tract: Adrenal glands are unremarkable. Slightly enlarged arterially enhancing partially exophytic mass of the peripheral superior pole of the left kidney measuring 2.6 x 2.4 cm previously 2.3 x 2.2 cm when measured similarly (series 12, image 19). Multiple additional simple fluid signal and hemorrhagic or proteinaceous  renal cysts, benign, for which no specific further follow-up or characterization is required. No obvious calculi or hydronephrosis.   Stomach/Bowel: Stomach is within normal limits. No evidence of bowel wall thickening, distention, or inflammatory changes.   Vascular/Lymphatic: Aortic atherosclerosis. No enlarged abdominal lymph nodes.   Other: No abdominal wall hernia or abnormality. No ascites.   Musculoskeletal: No acute or significant osseous findings.   IMPRESSION: 1. Slightly enlarged arterially enhancing partially exophytic mass of the peripheral superior pole of the left kidney measuring 2.6 x 2.4 cm, previously 2.3 x 2.2 cm when measured similarly. This is consistent with a slowly enlarging renal cell carcinoma. 2. No evidence of renal vein invasion, lymphadenopathy or metastatic disease in the abdomen. 3. Status post cholecystectomy.   Aortic Atherosclerosis (ICD10-I70.0).   Electronically Signed   By: Jearld Lesch M.D.   On: 05/11/2023 15:10  Personally reviewed above images and compared with previous. Agree with radiologic interpretation.    Assessment & Plan:    1. Renal mass  - Minimal growth since last year as indicated on MRI. - Options reviewed including continued surveillance with repeat MRI in a year. Chance of metastasizing at  this size is less than 5%. Option 2 is consider a procedure to burn or freeze it off and involve interventional radiology for a second opinion regarding this. Lastly pursue surgical intervention, although not the ideal choice due to age and other mitigating risk factors.   - She has decided to have a repeat MRI next year. She will call the week prior to have prescription for valium called in to take an hour before the MRI. She knows to have a driver.   Return in about 1 year (around 05/19/2024) for MRI.   [facility] [address] [place], [place] [address] [phone]

## 2023-05-30 ENCOUNTER — Ambulatory Visit: Payer: Medicare Other | Admitting: Urology

## 2023-06-09 ENCOUNTER — Telehealth: Payer: Self-pay

## 2023-06-09 DIAGNOSIS — N2889 Other specified disorders of kidney and ureter: Secondary | ICD-10-CM

## 2023-06-09 NOTE — Telephone Encounter (Signed)
Per Theresa Dodson patient called back stating that she wants to go ahead and proceed with IR referral instead of waiting on another MRI to be done.

## 2023-06-09 NOTE — Telephone Encounter (Signed)
Ok please make it happen  Vanna Scotland, MD

## 2023-06-09 NOTE — Addendum Note (Signed)
Addended by: Consuella Lose on: 06/09/2023 02:46 PM   Modules accepted: Orders

## 2023-06-09 NOTE — Telephone Encounter (Signed)
Patient advised. Referral placed  

## 2023-06-10 ENCOUNTER — Other Ambulatory Visit: Payer: Self-pay | Admitting: Urology

## 2023-06-10 DIAGNOSIS — N2889 Other specified disorders of kidney and ureter: Secondary | ICD-10-CM

## 2023-06-18 DIAGNOSIS — M65312 Trigger thumb, left thumb: Secondary | ICD-10-CM | POA: Insufficient documentation

## 2023-06-24 ENCOUNTER — Other Ambulatory Visit: Payer: Self-pay | Admitting: Interventional Radiology

## 2023-06-24 ENCOUNTER — Ambulatory Visit
Admission: RE | Admit: 2023-06-24 | Discharge: 2023-06-24 | Disposition: A | Discharge status: Home / Self Care | Payer: Medicare Other | Source: Ambulatory Visit | Attending: Urology

## 2023-06-24 DIAGNOSIS — N2889 Other specified disorders of kidney and ureter: Secondary | ICD-10-CM

## 2023-06-24 HISTORY — PX: IR RADIOLOGIST EVAL & MGMT: IMG5224

## 2023-06-24 NOTE — Consult Note (Signed)
Chief Complaint: Patient was seen in consultation today for left renal neoplasm at the request of Brandon,Ashley  Referring Physician(s): Brandon,Ashley  History of Present Illness: Theresa Dodson is a 77 y.o. femalereferred at the kind request of Dr. Apolinar Junes for evaluation of an enlarging enhancing mass arising from the superolateral aspect of the upper pole of the left kidney.  The mass was initially identified incidentally on renal ultrasound for workup of chronic kidney disease.  This subsequently led to an MRI performed in February 2023 demonstrating a 2.1 cm solid enhancing renal mass.  Surveillance MRI imaging performed in September 2023 demonstrated no significant interval change with a 2.2 cm enhancing renal mass.  Further surveillance imaging with follow-up MRI 1 year later in September 2024 demonstrates a demonstrable enlargement to a maximal diameter of 2.6 cm.  On imaging, the mass is in close proximity to the tail of the pancreas and the inferior pole of the spleen.  No evidence of renal vein invasion, lymphadenopathy or metastatic disease.  Due to her underlying age and comorbidities, she is not an optimal candidate for surgery.  Otherwise, Theresa Dodson is in her usual state of health and has no additional active complaints.  Past Medical History:  Diagnosis Date   Breast cancer (HCC)    Cancer (HCC)    skin ca   Chronic renal insufficiency    Difficult intubation    Diverticulitis    Generalized osteoarthritis    GERD (gastroesophageal reflux disease)    Gout    Gout    Hypercholesteremia    Hypertension    Pre-diabetes    Sleep apnea     Past Surgical History:  Procedure Laterality Date   breast benign mass removal  2012   BREAST BIOPSY Right 07/2011   NEG   BREAST BIOPSY Right 11/17/2020   U/ S bx, Q,  marker ANGIOLIPOMA. - NEGATIVE FOR MALIGNANCY.   BREAST BIOPSY Right 04/03/2021   stereo calcs, x marker, path pending   CARPAL TUNNEL RELEASE  Bilateral    CATARACT EXTRACTION Right    CHOLECYSTECTOMY     COLONOSCOPY WITH PROPOFOL N/A 06/26/2017   Procedure: COLONOSCOPY WITH PROPOFOL;  Surgeon: Christena Deem, MD;  Location: [facility] ENDOSCOPY;  Service: Endoscopy;  Laterality: N/A;   FOOT SURGERY     d/t gout   INCISIONAL HERNIA REPAIR N/A 08/13/2017   Procedure: HERNIA REPAIR INCISIONAL;  Surgeon: Ricarda Frame, MD;  Location: [facility] ORS;  Service: General;  Laterality: N/A;   MANDIBLE SURGERY     tooth abscess     UMBILICAL HERNIA REPAIR N/A 08/13/2017   Procedure: HERNIA REPAIR UMBILICAL ADULT;  Surgeon: Ricarda Frame, MD;  Location: [facility] ORS;  Service: General;  Laterality: N/A;   WRIST SURGERY      Allergies: Codeine  Medications: Prior to Admission medications   Medication Sig Start Date End Date Taking? Authorizing Provider  acetaminophen (TYLENOL) 650 MG CR tablet Take 650 mg by mouth 2 (two) times daily.    [provider]  allopurinol (ZYLOPRIM) 100 MG tablet Take 100 mg by mouth at bedtime.     [provider]  allopurinol (ZYLOPRIM) 300 MG tablet Take 300 mg by mouth daily.    [provider]  amLODipine (NORVASC) 5 MG tablet Take 5 mg by mouth daily.    [provider]  aspirin EC 81 MG tablet Take 81 mg by mouth at bedtime.     [provider]  atorvastatin (LIPITOR) 10 MG  tablet Take 10 mg by mouth at bedtime.     [provider]  benzonatate (TESSALON) 200 MG capsule Take 1 capsule (200 mg total) by mouth 3 (three) times daily as needed for cough. 06/14/22   Domenick Gong, MD  colchicine 0.6 MG tablet Take 0.6 mg by mouth daily as needed (pain).     [provider]  cyanocobalamin 1000 MCG tablet Take 1,000 mcg by mouth daily.     [provider]  docusate sodium (COLACE) 100 MG capsule Take 100 mg by mouth daily.    [provider]  famotidine (PEPCID) 20 MG tablet Take 20 mg by mouth 2 (two) times daily as needed for  heartburn.     [provider]  FARXIGA 10 MG TABS tablet Take 10 mg by mouth every morning. 10/18/21   [provider]  gabapentin (NEURONTIN) 600 MG tablet Take 600 mg by mouth 3 (three) times daily.    [provider]  guaiFENesin (MUCINEX) 600 MG 12 hr tablet Take 600 mg by mouth daily as needed for cough or to loosen phlegm (allergies).    [provider]  hydrochlorothiazide (HYDRODIURIL) 25 MG tablet Take 25 mg by mouth daily.    [provider]  ipratropium (ATROVENT) 0.06 % nasal spray Place 2 sprays into both nostrils 4 (four) times daily. 06/14/22   Domenick Gong, MD  lisinopril (PRINIVIL,ZESTRIL) 20 MG tablet  08/11/17   [provider]  omega-3 acid ethyl esters (LOVAZA) 1 G capsule Take 2 g by mouth 2 (two) times daily.     [provider]  Probiotic Product (PROBIOTIC PO) Take 1 tablet by mouth daily.    [provider]  promethazine-dextromethorphan (PROMETHAZINE-DM) 6.25-15 MG/5ML syrup Take 5 mLs by mouth 4 (four) times daily as needed for cough. 06/14/22   Domenick Gong, MD     Family History  Problem Relation Age of Onset   Ovarian cancer Mother    Stroke Father    Hypertension Sister    Heart disease Maternal Grandmother    Heart disease Maternal Grandfather    Heart disease Paternal Grandmother    Diabetes Paternal Grandfather    Breast cancer Neg Hx     Social History   Socioeconomic History   Marital status: Widowed    Spouse name: Not on file   Number of children: Not on file   Years of education: Not on file   Highest education level: Not on file  Occupational History   Not on file  Tobacco Use   Smoking status: Never   Smokeless tobacco: Never  Vaping Use   Vaping status: Never Used  Substance and Sexual Activity   Alcohol use: No   Drug use: No   Sexual activity: Not on file  Other Topics Concern   Not on file  Social History Narrative   Not on file   Social  Determinants of Health   Financial Resource Strain: Low Risk  (05/27/2022)   Received from [facility], [facility]   Overall Financial Resource Strain (CARDIA)    Difficulty of Paying Living Expenses: Not hard at all  Food Insecurity: No Food Insecurity (05/27/2022)   Received from [facility], [facility]   Hunger Vital Sign    Worried About Running Out of Food in the Last Year: Never true    Ran Out of Food in the Last Year: Never true  Transportation Needs: No Transportation  Needs (05/27/2022)   Received from [facility], [facility]   [facility] - Transportation    In the past 12 months, has lack of transportation kept you from medical appointments or from getting medications?: No    Lack of Transportation (Non-Medical): No  Physical Activity: Not on file  Stress: Not on file  Social Connections: Not on file    ECOG Status: 0 - Asymptomatic  Review of Systems: A 12 point ROS discussed and pertinent positives are indicated in the HPI above.  All other systems are negative.  Review of Systems  Vital Signs: BP (!) 157/70 (BP Location: Left Arm, Patient Position: Sitting, Cuff Size: Normal)   Pulse 64   Temp 97.8 F (36.6 C) (Oral)   Resp 15   SpO2 97%   Advance Care Plan: The advanced care plan/surrogate decision maker was discussed at the time of visit and the patient did not wish to discuss or was not able to name a surrogate decision maker or provide an advance care plan.    Physical Exam Constitutional:      General: She is not in acute distress.    Appearance: Normal appearance. She is obese.  HENT:     Head: Normocephalic and atraumatic.  Eyes:     General: No scleral icterus. Cardiovascular:     Rate and Rhythm: Normal rate.  Pulmonary:     Effort: Pulmonary effort is normal.  Abdominal:     General: There is no distension.     Palpations: Abdomen is  soft.     Tenderness: There is no abdominal tenderness.  Skin:    General: Skin is warm and dry.  Neurological:     Mental Status: She is alert and oriented to person, place, and time.  Psychiatric:        Behavior: Behavior normal.       Imaging: No results found.  Labs:  CBC: Recent Labs    01/16/23 1504  WBC 5.6  HGB 14.8  HCT 47.2*  PLT 224    COAGS: No results for input(s): "INR", "APTT" in the last 8760 hours.  BMP: Recent Labs    01/16/23 1504  NA 140  K 4.9  CL 106  CO2 24  GLUCOSE 110*  BUN 28*  CALCIUM 9.8  CREATININE 1.36*  GFRNONAA 40*    LIVER FUNCTION TESTS: No results for input(s): "BILITOT", "AST", "ALT", "ALKPHOS", "PROT", "ALBUMIN" in the last 8760 hours.  TUMOR MARKERS: No results for input(s): "AFPTM", "CEA", "CA199", "CHROMGRNA" in the last 8760 hours.  Assessment and Plan:  Extremely pleasant 77 year old female with a 2.6 cm arterially enhancing partially exophytic mass arising from the periphery of the upper pole of the left kidney.  The imaging appearance is highly concerning for a primary renal neoplasm, specifically a renal cell carcinoma.  The literature suggests that for arterially enhancing renal lesions, approximately 80% represent a malignancy such as renal cell carcinoma while the other 20% represent a benign neoplasm such as oncocytoma or lipid poor angiomyolipoma.  We discussed the natural history of both benign and malignant lesions and that given the high probability of malignancy that treatment is typically pursued.  She has discussed with Dr. Francena Hanly who feels that she is a higher risk surgical candidate given her prior ventral hernia repair, mesh and relatively advanced age.  She is an excellent candidate for percutaneous ablation.  The only risk factor for her is the proximity of the tail of the pancreas  to the lesion which will require hydrodissection and careful attention during the time of ablation.  She is aware and  understands the risk for pancreatitis.  Given the avid enhancement of the lesion and its peripheral exophytic location, I believe that microwave ablation would be the optimal choice.  Again, we will proceed with careful hydrodissection to ensure protection of the pancreatic tail.  Biopsy will be performed at the time of ablation if feasible.  1.) Please schedule for percutaneous microwave ablation and concurrent biopsy need to be performed in CT at [facility] under general anesthesia.  I would like to proceed with this procedure as soon as possible, preferably in the next few weeks.  Thank you for this interesting consult.  I greatly enjoyed meeting Theresa Dodson and look forward to participating in their care.  A copy of this report was sent to the requesting provider on this date.  Electronically Signed: Sterling Big 06/24/2023, 12:06 PM   I spent a total of 40 Minutes  in face to face in clinical consultation, greater than 50% of which was counseling/coordinating care for left renal mass.

## 2023-07-03 ENCOUNTER — Encounter: Payer: Self-pay | Admitting: Urgent Care

## 2023-07-03 ENCOUNTER — Encounter (HOSPITAL_COMMUNITY): Payer: Self-pay | Admitting: Radiology

## 2023-07-04 ENCOUNTER — Encounter
Admission: RE | Admit: 2023-07-04 | Discharge: 2023-07-04 | Disposition: A | Discharge status: Home / Self Care | Payer: Medicare Other | Source: Ambulatory Visit | Attending: Interventional Radiology | Admitting: Interventional Radiology

## 2023-07-04 ENCOUNTER — Encounter: Payer: Self-pay | Admitting: Urgent Care

## 2023-07-04 ENCOUNTER — Other Ambulatory Visit: Payer: Self-pay | Admitting: Physician Assistant

## 2023-07-04 ENCOUNTER — Other Ambulatory Visit: Payer: Self-pay

## 2023-07-04 VITALS — BP 168/56 | HR 65 | Resp 16

## 2023-07-04 DIAGNOSIS — Z01818 Encounter for other preprocedural examination: Secondary | ICD-10-CM

## 2023-07-04 DIAGNOSIS — I10 Essential (primary) hypertension: Secondary | ICD-10-CM | POA: Insufficient documentation

## 2023-07-04 DIAGNOSIS — Z01812 Encounter for preprocedural laboratory examination: Secondary | ICD-10-CM | POA: Insufficient documentation

## 2023-07-04 DIAGNOSIS — E119 Type 2 diabetes mellitus without complications: Secondary | ICD-10-CM | POA: Diagnosis not present

## 2023-07-04 HISTORY — DX: Obstructive sleep apnea (adult) (pediatric): G47.33

## 2023-07-04 HISTORY — DX: Long term (current) use of aspirin: Z79.82

## 2023-07-04 HISTORY — DX: Type 2 diabetes mellitus without complications: E11.9

## 2023-07-04 HISTORY — DX: Other specified disorders of kidney and ureter: N28.89

## 2023-07-04 HISTORY — DX: Unspecified malignant neoplasm of skin, unspecified: C44.90

## 2023-07-04 HISTORY — DX: Atherosclerotic heart disease of native coronary artery without angina pectoris: I25.10

## 2023-07-04 HISTORY — DX: Other complications of anesthesia, initial encounter: T88.59XA

## 2023-07-04 HISTORY — DX: Personal history of other diseases of the digestive system: Z87.19

## 2023-07-04 HISTORY — DX: Atherosclerosis of aorta: I70.0

## 2023-07-04 LAB — CBC
HCT: 46.8 % — ABNORMAL HIGH (ref 36.0–46.0)
Hemoglobin: 15.3 g/dL — ABNORMAL HIGH (ref 12.0–15.0)
MCH: 30.4 pg (ref 26.0–34.0)
MCHC: 32.7 g/dL (ref 30.0–36.0)
MCV: 92.9 fL (ref 80.0–100.0)
Platelets: 206 10*3/uL (ref 150–400)
RBC: 5.04 MIL/uL (ref 3.87–5.11)
RDW: 14.7 % (ref 11.5–15.5)
WBC: 6.8 10*3/uL (ref 4.0–10.5)
nRBC: 0 % (ref 0.0–0.2)

## 2023-07-04 LAB — BASIC METABOLIC PANEL
Anion gap: 10 (ref 5–15)
BUN: 21 mg/dL (ref 8–23)
CO2: 26 mmol/L (ref 22–32)
Calcium: 9.1 mg/dL (ref 8.9–10.3)
Chloride: 104 mmol/L (ref 98–111)
Creatinine, Ser: 1.03 mg/dL — ABNORMAL HIGH (ref 0.44–1.00)
GFR, Estimated: 56 mL/min — ABNORMAL LOW (ref >60)
Glucose, Bld: 95 mg/dL (ref 70–99)
Potassium: 3.7 mmol/L (ref 3.5–5.1)
Sodium: 140 mmol/L (ref 135–145)

## 2023-07-04 LAB — PROTIME-INR
INR: 0.9 (ref 0.8–1.2)
Prothrombin Time: 12.5 s (ref 11.4–15.2)

## 2023-07-04 LAB — TYPE AND SCREEN
ABO/RH(D): O POS
Antibody Screen: NEGATIVE

## 2023-07-04 NOTE — Patient Instructions (Addendum)
Your procedure is scheduled on: 07/07/23 - Monday  Report to [facility] at 7:30 am.  REMEMBER: Instructions that are not followed completely may result in serious medical risk, up to and including death; or upon the discretion of your surgeon and anesthesiologist your surgery may need to be rescheduled.  Do not eat food after midnight the night before surgery.  No gum chewing or hard candies.  You may drink water up to 2 hours before you are scheduled to arrive for your surgery. Do not drink anything within 2 hours of your scheduled arrival time.  One week prior to surgery: Stop Anti-inflammatories (NSAIDS) such as Advil, Aleve, Ibuprofen, Motrin, Naproxen, Naprosyn and Aspirin based products such as Excedrin, Goody's Powder, BC Powder. You may take Tylenol if needed for pain up until the day of surgery.  Stop ANY OVER THE COUNTER supplements until after surgery: omega-3 acid   Stop taking FARXIGA 07/04/23.  Hold hydrochlorothiazide and lisinopril (PRINIVIL,ZESTRIL) on the morning of surgery.  ON THE DAY OF SURGERY ONLY TAKE THESE MEDICATIONS WITH SIPS OF WATER:  amLODipine (NORVASC)  famotidine (PEPCID)  acetaminophen (TYLENOL)    No Alcohol for 24 hours before or after surgery.  No Smoking including e-cigarettes for 24 hours before surgery.  No chewable tobacco products for at least 6 hours before surgery.  No nicotine patches on the day of surgery.  Do not use any "recreational" drugs for at least a week (preferably 2 weeks) before your surgery.  Please be advised that the combination of cocaine and anesthesia may have negative outcomes, up to and including death. If you test positive for cocaine, your surgery will be cancelled.  On the morning of surgery brush your teeth with toothpaste and water, you may rinse your mouth with mouthwash if you wish. Do not swallow any toothpaste or mouthwash.  Use CHG Soap or wipes as directed on instruction sheet.  Do not  wear jewelry, make-up, hairpins, clips or nail polish.  For welded (permanent) jewelry: bracelets, anklets, waist bands, etc.  Please have this removed prior to surgery.  If it is not removed, there is a chance that hospital personnel will need to cut it off on the day of surgery.  Do not wear lotions, powders, or perfumes.   Do not shave body hair from the neck down 48 hours before surgery.  Contact lenses, hearing aids and dentures may not be worn into surgery.  Do not bring valuables to the hospital. [facility] is not responsible for any missing/lost belongings or valuables.   Notify your doctor if there is any change in your medical condition (cold, fever, infection).  Wear comfortable clothing (specific to your surgery type) to the hospital.  After surgery, you can help prevent lung complications by doing breathing exercises.  Take deep breaths and cough every 1-2 hours. Your doctor may order a device called an Incentive Spirometer to help you take deep breaths. When coughing or sneezing, hold a pillow firmly against your incision with both hands. This is called "splinting." Doing this helps protect your incision. It also decreases belly discomfort.  If you are being admitted to the hospital overnight, leave your suitcase in the car. After surgery it may be brought to your room.  In case of increased patient census, it may be necessary for you, the patient, to continue your postoperative care in the Same Day Surgery department.  If you are being discharged the day of surgery, you will not be allowed to drive home.  You will need a responsible individual to drive you home and stay with you for 24 hours after surgery.   If you are taking public transportation, you will need to have a responsible individual with you.  Please call the Pre-admissions Testing Dept. at [phone] if you have any questions about these instructions.  Surgery Visitation Policy:  Patients having  surgery or a procedure may have two visitors.  Children under the age of 61 must have an adult with them who is not the patient.  Inpatient Visitation:    Visiting hours are 7 a.m. to 8 p.m. Up to four visitors are allowed at one time in a patient room. The visitors may rotate out with other people during the day.  One visitor age 104 or older may stay with the patient overnight and must be in the room by 8 p.m.     Preparing for Surgery with CHLORHEXIDINE GLUCONATE (CHG) Soap  Chlorhexidine Gluconate (CHG) Soap  o An antiseptic cleaner that kills germs and bonds with the skin to continue killing germs even after washing  o Used for showering the night before surgery and morning of surgery  Before surgery, you can play an important role by reducing the number of germs on your skin.  CHG (Chlorhexidine gluconate) soap is an antiseptic cleanser which kills germs and bonds with the skin to continue killing germs even after washing.  Please do not use if you have an allergy to CHG or antibacterial soaps. If your skin becomes reddened/irritated stop using the CHG.  1. Shower the NIGHT BEFORE SURGERY and the MORNING OF SURGERY with CHG soap.  2. If you choose to wash your hair, wash your hair first as usual with your normal shampoo.  3. After shampooing, rinse your hair and body thoroughly to remove the shampoo.  4. Use CHG as you would any other liquid soap. You can apply CHG directly to the skin and wash gently with a scrungie or a clean washcloth.  5. Apply the CHG soap to your body only from the neck down. Do not use on open wounds or open sores. Avoid contact with your eyes, ears, mouth, and genitals (private parts). Wash face and genitals (private parts) with your normal soap.  6. Wash thoroughly, paying special attention to the area where your surgery will be performed.  7. Thoroughly rinse your body with warm water.  8. Do not shower/wash with your normal soap after using and  rinsing off the CHG soap.  9. Pat yourself dry with a clean towel.  10. Wear clean pajamas to bed the night before surgery.  12. Place clean sheets on your bed the night of your first shower and do not sleep with pets.  13. Shower again with the CHG soap on the day of surgery prior to arriving at the hospital.  14. Do not apply any deodorants/lotions/powders.  15. Please wear clean clothes to the hospital.

## 2023-07-06 NOTE — H&P (Signed)
Chief Complaint: Left kidney masss. Patient presents for concurrent left renal mass biopsy and microwave ablation.   Referring Physician(s): Dr. .Vanna Scotland  Supervising Physician: Malachy Moan  Patient Status: [facility] - Out-pt  History of Present Illness: Theresa Dodson is a 77 y.o. female  outpatient. History of HTN, HLD, GERD, breast cancer, skin cancer, CKD. Surgical history includes ventral hernia repair with mesh Found to have a left kidney mass concerning for possible renal cell carcinoma during [place] evaluating CKD. Follow up MR Adb from 9.22.24 reads  Slightly enlarged arterially enhancing partially exophytic mass of the peripheral superior pole of the left kidney measuring 2.6 x 2.4 cm, previously 2.3 x 2.2 cm when measured similarly. This is consistent with a slowly enlarging renal cell carcinoma. The patient was seen for consultation in the Interventional Radiology Clinic  on 11.5.24 with IR Attending Dr. Malachy Moan. At that time a detailed discussion regarding the Patient's medical condition including but not limited to possible treatment options took place. Following that discussion the Patient elected to proceed with concurrent left renal mass biopsy and microwave ablation. The Patient presents today for concurrent renal  mass biopsy and microwave ablation with general anesthesia.  Currently without any significant complaints. Patient alert and laying in bed,calm. Denies any fevers, headache, chest pain, SOB, cough, abdominal pain, nausea, vomiting or bleeding.  Labs from 11.15.24 are within acceptable parameters. Patient is on 81 mg of ASA. Last dose is 1 week ago.Allergies include codeine. Patient has been NPO since midnight.   Return precautions and treatment recommendations and follow-up discussed with the patient  who is agreeable with the plan.    Past Medical History:  Diagnosis Date   Aortic atherosclerosis (HCC)    Breast cancer (HCC)    benign   CAD  (coronary artery disease)    a.) cCTA 02/17/2023: Ca2+ 80.9 (49th %ile)   Chronic renal insufficiency    Complication of anesthesia    difficult intubation   Difficult intubation    Diverticulitis    Generalized osteoarthritis    GERD (gastroesophageal reflux disease)    Gout    Gout    History of hiatal hernia    Hypercholesteremia    Hypertension    Left renal mass    Long-term use of aspirin therapy    OSA on CPAP    Skin cancer    T2DM (type 2 diabetes mellitus) (HCC)     Past Surgical History:  Procedure Laterality Date   breast benign mass removal  2012   BREAST BIOPSY Right 07/2011   NEG   BREAST BIOPSY Right 11/17/2020   U/ S bx, Q,  marker ANGIOLIPOMA. - NEGATIVE FOR MALIGNANCY.   BREAST BIOPSY Right 04/03/2021   stereo calcs, x marker, path pending   CARPAL TUNNEL RELEASE Bilateral    CATARACT EXTRACTION Bilateral    CHOLECYSTECTOMY     COLONOSCOPY WITH PROPOFOL N/A 06/26/2017   Procedure: COLONOSCOPY WITH PROPOFOL;  Surgeon: Christena Deem, MD;  Location: [facility] ENDOSCOPY;  Service: Endoscopy;  Laterality: N/A;   FOOT SURGERY     d/t gout   INCISIONAL HERNIA REPAIR N/A 08/13/2017   Procedure: HERNIA REPAIR INCISIONAL;  Surgeon: Ricarda Frame, MD;  Location: [facility] ORS;  Service: General;  Laterality: N/A;   IR RADIOLOGIST EVAL & MGMT  06/24/2023   MANDIBLE SURGERY     4 surgeries   tooth abscess     UMBILICAL HERNIA REPAIR N/A 08/13/2017   Procedure: HERNIA REPAIR UMBILICAL ADULT;  Surgeon: Ricarda Frame, MD;  Location: [facility] ORS;  Service: General;  Laterality: N/A;   WRIST SURGERY      Allergies: Codeine  Medications: Prior to Admission medications   Medication Sig Start Date End Date Taking? Authorizing Provider  acetaminophen (TYLENOL) 650 MG CR tablet Take 650 mg by mouth 2 (two) times daily.    [provider]  allopurinol (ZYLOPRIM) 100 MG tablet Take 100 mg by mouth at bedtime.     [provider]  allopurinol  (ZYLOPRIM) 300 MG tablet Take 300 mg by mouth daily.    [provider]  amLODipine (NORVASC) 5 MG tablet Take 5 mg by mouth daily.    [provider]  aspirin EC 81 MG tablet Take 81 mg by mouth at bedtime.     [provider]  atorvastatin (LIPITOR) 10 MG tablet Take 10 mg by mouth at bedtime.     [provider]  benzonatate (TESSALON) 200 MG capsule Take 1 capsule (200 mg total) by mouth 3 (three) times daily as needed for cough. 06/14/22   Domenick Gong, MD  colchicine 0.6 MG tablet Take 0.6 mg by mouth daily as needed (pain).     [provider]  cyanocobalamin 1000 MCG tablet Take 1,000 mcg by mouth daily.     [provider]  docusate sodium (COLACE) 100 MG capsule Take 100 mg by mouth daily.    [provider]  famotidine (PEPCID) 20 MG tablet Take 20 mg by mouth 2 (two) times daily as needed for heartburn.     [provider]  FARXIGA 10 MG TABS tablet Take 10 mg by mouth every morning. 10/18/21   [provider]  gabapentin (NEURONTIN) 600 MG tablet Take 600 mg by mouth as needed.    [provider]  guaiFENesin (MUCINEX) 600 MG 12 hr tablet Take 600 mg by mouth daily as needed for cough or to loosen phlegm (allergies).    [provider]  hydrochlorothiazide (HYDRODIURIL) 25 MG tablet Take 25 mg by mouth as needed.    [provider]  ipratropium (ATROVENT) 0.06 % nasal spray Place 2 sprays into both nostrils 4 (four) times daily. 06/14/22   Domenick Gong, MD  lisinopril (PRINIVIL,ZESTRIL) 20 MG tablet  08/11/17   [provider]  omega-3 acid ethyl esters (LOVAZA) 1 G capsule Take 2 g by mouth 2 (two) times daily.     [provider]  Probiotic Product (PROBIOTIC PO) Take 1 tablet by mouth daily.    [provider]  promethazine-dextromethorphan (PROMETHAZINE-DM) 6.25-15 MG/5ML syrup Take 5 mLs by mouth 4 (four) times daily as needed for cough.  06/14/22   Domenick Gong, MD     Family History  Problem Relation Age of Onset   Ovarian cancer Mother    Stroke Father    Hypertension Sister    Heart disease Maternal Grandmother    Heart disease Maternal Grandfather    Heart disease Paternal Grandmother    Diabetes Paternal Grandfather    Breast cancer Neg Hx     Social History   Socioeconomic History   Marital status: Widowed    Spouse name: Not on file   Number of children: Not on file   Years of education: Not on file   Highest education level: Not on file  Occupational History   Not on file  Tobacco Use   Smoking status: Never   Smokeless tobacco: Never  Vaping Use   Vaping status: Never  Used  Substance and Sexual Activity   Alcohol use: No   Drug use: No   Sexual activity: Not on file  Other Topics Concern   Not on file  Social History Narrative   Lives alone, great niece lives with her    Social Determinants of Health   Financial Resource Strain: Low Risk  (05/27/2022)   Received from [facility], [facility]   Overall Financial Resource Strain (CARDIA)    Difficulty of Paying Living Expenses: Not hard at all  Food Insecurity: No Food Insecurity (05/27/2022)   Received from [facility], [facility]   Hunger Vital Sign    Worried About Running Out of Food in the Last Year: Never true    Ran Out of Food in the Last Year: Never true  Transportation Needs: No Transportation Needs (05/27/2022)   Received from [facility], [facility]   [facility] - Transportation    In the past 12 months, has lack of transportation kept you from medical appointments or from getting medications?: No    Lack of Transportation (Non-Medical): No  Physical Activity: Not on file  Stress: Not on file  Social Connections: Not on file     Review of Systems: A 12 point ROS discussed and pertinent positives are indicated in  the HPI above.  All other systems are negative.  Review of Systems  Constitutional:  Negative for fatigue and fever.  HENT:  Negative for congestion.   Respiratory:  Negative for cough and shortness of breath.   Gastrointestinal:  Negative for abdominal pain, diarrhea, nausea and vomiting.    Vital Signs: HR 70 , rr 19. 137/70 O2 98 on ra  Advance Care Plan: The advanced care plan/surrogate decision maker was discussed at the time of visit and documented in the medical record. sister    Physical Exam Vitals and nursing note reviewed.  Constitutional:      Appearance: She is well-developed. She is obese.  HENT:     Head: Normocephalic and atraumatic.  Eyes:     Conjunctiva/sclera: Conjunctivae normal.  Cardiovascular:     Rate and Rhythm: Normal rate and regular rhythm.  Pulmonary:     Effort: Pulmonary effort is normal.  Musculoskeletal:        General: Normal range of motion.     Cervical back: Normal range of motion.  Skin:    General: Skin is warm.  Neurological:     General: No focal deficit present.     Mental Status: She is alert and oriented to person, place, and time.  Psychiatric:        Mood and Affect: Mood normal.        Behavior: Behavior normal.        Thought Content: Thought content normal.        Judgment: Judgment normal.     Imaging: IR Radiologist Eval & Mgmt  Result Date: 06/24/2023 EXAM: NEW PATIENT OFFICE VISIT CHIEF COMPLAINT: SEE NOTE IN EPIC HISTORY OF PRESENT ILLNESS: SEE NOTE IN EPIC REVIEW OF SYSTEMS: SEE NOTE IN EPIC PHYSICAL EXAMINATION: SEE NOTE IN EPIC ASSESSMENT AND PLAN: SEE NOTE IN EPIC Electronically Signed   By: Malachy Moan M.D.   On: 06/24/2023 12:06    Labs:  CBC: Recent Labs    01/16/23 1504 07/04/23 1337  WBC 5.6 6.8  HGB 14.8 15.3*  HCT 47.2* 46.8*  PLT 224 206    COAGS: Recent  Labs    07/04/23 1337  INR 0.9    BMP: Recent Labs    01/16/23 1504 07/04/23 1337  NA 140 140  K 4.9 3.7  CL 106 104   CO2 24 26  GLUCOSE 110* 95  BUN 28* 21  CALCIUM 9.8 9.1  CREATININE 1.36* 1.03*  GFRNONAA 40* 56*    LIVER FUNCTION TESTS: No results for input(s): "BILITOT", "AST", "ALT", "ALKPHOS", "PROT", "ALBUMIN" in the last 8760 hours.   Assessment and Plan:  77 y.o. female outpatient. History of HTN, HLD, GERD, breast cancer, skin cancer, CKD. Surgical history includes ventral hernia repair with mesh Found to have a left kidney mass concerning for possible renal cell carcinoma during [place] evaluating CKD. Follow up MR Adb from 9.22.24 reads  Slightly enlarged arterially enhancing partially exophytic mass of the peripheral superior pole of the left kidney measuring 2.6 x 2.4 cm, previously 2.3 x 2.2 cm when measured similarly. This is consistent with a slowly enlarging renal cell carcinoma. The patient was seen for consultation in the Interventional Radiology Clinic  on 11.5.24 with IR Attending Dr. Malachy Moan. At that time a detailed discussion regarding the Patient's medical condition including but not limited to possible treatment options took place. Following that discussion the Patient elected to proceed with concurrent left renal mass biopsy and microwave ablation. The Patient presents today for concurrent renal  mass biopsy and microwave ablation with general anesthesia.   Risks and benefits of concurrent left renal mass biopsy and microwave ablation were discussed with the patient including, but not limited to, bleeding, infection, vascular injury, contrast induced renal failure, post procedural pain, nausea, vomiting, fatigue, progression of liver failure, chemical cholecystitis, or need for additional procedures.  This interventional procedure involves the use of X-rays and because of the nature of the planned procedure, it is possible that we will have prolonged use of X-ray fluoroscopy.  Potential radiation risks to you include (but are not limited to) the following: - A slightly  elevated risk for cancer  several years later in life. This risk is typically less than 0.5% percent. This risk is low in comparison to the normal incidence of human cancer, which is 33% for women and 50% for men according to the [employer] Society. - Radiation induced injury can include skin redness, resembling a rash, tissue breakdown / ulcers and hair loss (which can be temporary or permanent).   The likelihood of either of these occurring depends on the difficulty of the procedure and whether you are sensitive to radiation due to previous procedures, disease, or genetic conditions.   IF your procedure requires a prolonged use of radiation, you will be notified and given written instructions for further action.  It is your responsibility to monitor the irradiated area for the 2 weeks following the procedure and to notify your physician if you are concerned that you have suffered a radiation induced injury.    All of the patient's questions were answered, patient is agreeable to proceed.  Consent signed and in chart.     Thank you for this interesting consult.  I greatly enjoyed meeting Theresa Dodson and look forward to participating in their care.  A copy of this report was sent to the requesting provider on this date.  Electronically Signed: Alene Mires, NP 07/07/2023, 8:06 AM   I spent a total of    25 Minutes in face to face in clinical consultation, greater than 50% of which was counseling/coordinating care for  left renal mass biopsy and concurrent microwave ablation

## 2023-07-07 ENCOUNTER — Other Ambulatory Visit: Payer: Self-pay

## 2023-07-07 ENCOUNTER — Ambulatory Visit
Admission: RE | Admit: 2023-07-07 | Discharge: 2023-07-07 | Disposition: A | Discharge status: Home / Self Care | Payer: Medicare Other | Source: Ambulatory Visit | Attending: Interventional Radiology | Admitting: Interventional Radiology

## 2023-07-07 ENCOUNTER — Other Ambulatory Visit: Payer: Self-pay | Admitting: Interventional Radiology

## 2023-07-07 ENCOUNTER — Encounter: Payer: Self-pay | Admitting: Certified Registered"

## 2023-07-07 ENCOUNTER — Other Ambulatory Visit (HOSPITAL_COMMUNITY): Payer: Self-pay | Admitting: Radiology

## 2023-07-07 VITALS — BP 132/51 | HR 89 | Temp 97.6°F | Resp 17 | Ht 62.0 in | Wt 205.5 lb

## 2023-07-07 DIAGNOSIS — C642 Malignant neoplasm of left kidney, except renal pelvis: Secondary | ICD-10-CM | POA: Diagnosis present

## 2023-07-07 DIAGNOSIS — N2889 Other specified disorders of kidney and ureter: Secondary | ICD-10-CM

## 2023-07-07 DIAGNOSIS — E119 Type 2 diabetes mellitus without complications: Secondary | ICD-10-CM

## 2023-07-07 DIAGNOSIS — Z01812 Encounter for preprocedural laboratory examination: Secondary | ICD-10-CM

## 2023-07-07 DIAGNOSIS — Z01818 Encounter for other preprocedural examination: Secondary | ICD-10-CM

## 2023-07-07 LAB — GLUCOSE, CAPILLARY: Glucose-Capillary: 155 mg/dL — ABNORMAL HIGH (ref 70–99)

## 2023-07-07 MED ORDER — OXYCODONE HCL 5 MG PO TABS: 5.0000 mg | ORAL_TABLET | Freq: Once | ORAL | Status: DC | PRN

## 2023-07-07 MED ORDER — PROPOFOL 10 MG/ML IV BOLUS
INTRAVENOUS | Status: DC | PRN
  Administered 2023-07-07: 90 mg via INTRAVENOUS

## 2023-07-07 MED ORDER — LIDOCAINE HCL (CARDIAC) PF 100 MG/5ML IV SOSY
PREFILLED_SYRINGE | INTRAVENOUS | Status: DC | PRN
  Administered 2023-07-07: 80 mg via INTRAVENOUS

## 2023-07-07 MED ORDER — ORAL CARE MOUTH RINSE: 15.0000 mL | Freq: Once | OROMUCOSAL | Status: DC

## 2023-07-07 MED ORDER — ROCURONIUM BROMIDE 100 MG/10ML IV SOLN
INTRAVENOUS | Status: DC | PRN
  Administered 2023-07-07: 50 mg via INTRAVENOUS
  Administered 2023-07-07: 10 mg via INTRAVENOUS
  Administered 2023-07-07 (×2): 20 mg via INTRAVENOUS

## 2023-07-07 MED ORDER — FENTANYL CITRATE PF 50 MCG/ML IJ SOSY
25.0000 ug | PREFILLED_SYRINGE | INTRAMUSCULAR | Status: DC | PRN
  Filled 2023-07-07: qty 1

## 2023-07-07 MED ORDER — DEXAMETHASONE SODIUM PHOSPHATE 10 MG/ML IJ SOLN
INTRAMUSCULAR | Status: DC | PRN
  Administered 2023-07-07: 10 mg via INTRAVENOUS

## 2023-07-07 MED ORDER — SODIUM CHLORIDE 0.9 % IV SOLN: INTRAVENOUS | Status: DC

## 2023-07-07 MED ORDER — OXYCODONE HCL 5 MG/5ML PO SOLN: 5.0000 mg | Freq: Once | ORAL | Status: DC | PRN

## 2023-07-07 MED ORDER — CHLORHEXIDINE GLUCONATE 0.12 % MT SOLN
15.0000 mL | Freq: Once | OROMUCOSAL | Status: DC
  Filled 2023-07-07: qty 15

## 2023-07-07 MED ORDER — GLYCOPYRROLATE 0.2 MG/ML IJ SOLN
INTRAMUSCULAR | Status: DC | PRN
  Administered 2023-07-07: .2 mg via INTRAVENOUS

## 2023-07-07 MED ORDER — EPHEDRINE SULFATE-NACL 50-0.9 MG/10ML-% IV SOSY
PREFILLED_SYRINGE | INTRAVENOUS | Status: DC | PRN
  Administered 2023-07-07: 5 mg via INTRAVENOUS

## 2023-07-07 MED ORDER — SUCCINYLCHOLINE CHLORIDE 200 MG/10ML IV SOSY
PREFILLED_SYRINGE | INTRAVENOUS | Status: DC | PRN
  Administered 2023-07-07: 120 mg via INTRAVENOUS

## 2023-07-07 MED ORDER — FENTANYL CITRATE (PF) 100 MCG/2ML IJ SOLN
INTRAMUSCULAR | Status: DC | PRN
  Administered 2023-07-07 (×2): 50 ug via INTRAVENOUS

## 2023-07-07 MED ORDER — IOHEXOL 350 MG/ML SOLN
100.0000 mL | Freq: Once | INTRAVENOUS | Status: AC | PRN
  Administered 2023-07-07: 100 mL via INTRAVENOUS

## 2023-07-07 MED ORDER — SUGAMMADEX SODIUM 200 MG/2ML IV SOLN
INTRAVENOUS | Status: DC | PRN
  Administered 2023-07-07: 186.4 mg via INTRAVENOUS

## 2023-07-07 MED ORDER — ONDANSETRON HCL 4 MG/2ML IJ SOLN
INTRAMUSCULAR | Status: DC | PRN
  Administered 2023-07-07: 4 mg via INTRAVENOUS

## 2023-07-07 MED ORDER — PHENYLEPHRINE HCL-NACL 20-0.9 MG/250ML-% IV SOLN
INTRAVENOUS | Status: DC | PRN
  Administered 2023-07-07: 25 ug/min via INTRAVENOUS

## 2023-07-07 NOTE — Procedures (Signed)
Interventional Radiology Procedure Note  Procedure: CT guided biopsy of LEFT renal mass followed by MWA.  Complications: None   Estimated Blood Loss: None  Recommendations: - Path sent - Bedrest x 4 hrs - Anticipate DC home   Signed,  Sterling Big, MD

## 2023-07-07 NOTE — Transfer of Care (Signed)
Immediate Anesthesia Transfer of Care Note  Patient: Theresa Dodson  Procedure(s) Performed: CT RENAL TUMOR ABLATION UNILATERAL  Patient Location: PACU  Anesthesia Type:General  Level of Consciousness: awake, alert , and oriented  Airway & Oxygen Therapy: Patient Spontanous Breathing and Patient connected to face mask oxygen  Post-op Assessment: Report given to RN and Post -op Vital signs reviewed and stable  Post vital signs: Reviewed and stable  Last Vitals:  Vitals Value Taken Time  BP 134/55   Temp 97   Pulse 83   Resp 14   SpO2 99     Last Pain:  Vitals:   07/07/23 0806  TempSrc: Oral  PainSc: 0-No pain         Complications:  Encounter Notable Events  Notable Event Outcome Phase Comment  Difficult to intubate - expected  Intraprocedure Filed from anesthesia note documentation.

## 2023-07-07 NOTE — Progress Notes (Signed)
Patient here today for renal ablation under general anesthesia, post intubation with oral airway, 14 FR foley placed under sterile protocol per Arlan Organ with good yellow urine flowing with insertion. Patient tolerated well.

## 2023-07-07 NOTE — Anesthesia Procedure Notes (Signed)
Procedure Name: Intubation Date/Time: 07/07/2023 9:37 AM  Performed by: Malva Cogan, CRNAPre-anesthesia Checklist: Patient identified, Patient being monitored, Timeout performed, Emergency Drugs available and Suction available Patient Re-evaluated:Patient Re-evaluated prior to induction Oxygen Delivery Method: Circle system utilized Preoxygenation: Pre-oxygenation with 100% oxygen Induction Type: IV induction Ventilation: Mask ventilation without difficulty and Oral airway inserted - appropriate to patient size Laryngoscope Size: McGrath and 4 Grade View: Grade II Tube type: Oral Tube size: 6.5 mm Number of attempts: 1 Airway Equipment and Method: Stylet Placement Confirmation: ETT inserted through vocal cords under direct vision, positive ETCO2 and breath sounds checked- equal and bilateral Secured at: 22 cm Tube secured with: Tape Dental Injury: Teeth and Oropharynx as per pre-operative assessment  Difficulty Due To: Difficulty was anticipated, Difficult Airway- due to anterior larynx, Difficult Airway- due to large tongue and Difficult Airway- due to reduced neck mobility

## 2023-07-07 NOTE — Anesthesia Preprocedure Evaluation (Signed)
Anesthesia Evaluation  Patient identified by MRN, date of birth, ID band Patient awake    Reviewed: Allergy & Precautions, NPO status , Patient's Chart, lab work & pertinent test results  History of Anesthesia Complications (+) DIFFICULT AIRWAY and history of anesthetic complications  Airway Mallampati: III  TM Distance: <3 FB Neck ROM: full    Dental  (+) Chipped   Pulmonary sleep apnea    Pulmonary exam normal        Cardiovascular hypertension, (-) angina + CAD  (-) Past MI Normal cardiovascular exam     Neuro/Psych  PSYCHIATRIC DISORDERS       Neuromuscular disease    GI/Hepatic Neg liver ROS, hiatal hernia,GERD  Controlled,,  Endo/Other  diabetesHypothyroidism    Renal/GU Renal disease  negative genitourinary   Musculoskeletal   Abdominal   Peds  Hematology negative hematology ROS (+)   Anesthesia Other Findings Past Medical History: No date: Aortic atherosclerosis (HCC) No date: Breast cancer (HCC)     Comment:  benign No date: CAD (coronary artery disease)     Comment:  a.) cCTA 02/17/2023: Ca2+ 80.9 (49th %ile) No date: Chronic renal insufficiency No date: Complication of anesthesia     Comment:  difficult intubation No date: Difficult intubation No date: Diverticulitis No date: Generalized osteoarthritis No date: GERD (gastroesophageal reflux disease) No date: Gout No date: Gout No date: History of hiatal hernia No date: Hypercholesteremia No date: Hypertension No date: Left renal mass No date: Long-term use of aspirin therapy No date: OSA on CPAP No date: Skin cancer No date: T2DM (type 2 diabetes mellitus) (HCC)  Past Surgical History: 2012: breast benign mass removal 07/2011: BREAST BIOPSY; Right     Comment:  NEG 11/17/2020: BREAST BIOPSY; Right     Comment:  U/ S bx, Q,  marker ANGIOLIPOMA. - NEGATIVE FOR               MALIGNANCY. 04/03/2021: BREAST BIOPSY; Right     Comment:   stereo calcs, x marker, path pending No date: CARPAL TUNNEL RELEASE; Bilateral No date: CATARACT EXTRACTION; Bilateral No date: CHOLECYSTECTOMY 06/26/2017: COLONOSCOPY WITH PROPOFOL; N/A     Comment:  Procedure: COLONOSCOPY WITH PROPOFOL;  Surgeon:               Christena Deem, MD;  Location: [facility] ENDOSCOPY;                Service: Endoscopy;  Laterality: N/A; No date: FOOT SURGERY     Comment:  d/t gout 08/13/2017: INCISIONAL HERNIA REPAIR; N/A     Comment:  Procedure: HERNIA REPAIR INCISIONAL;  Surgeon: Ricarda Frame, MD;  Location: [facility] ORS;  Service: General;                Laterality: N/A; 06/24/2023: IR RADIOLOGIST EVAL & MGMT No date: MANDIBLE SURGERY     Comment:  4 surgeries No date: tooth abscess 08/13/2017: UMBILICAL HERNIA REPAIR; N/A     Comment:  Procedure: HERNIA REPAIR UMBILICAL ADULT;  Surgeon:               Ricarda Frame, MD;  Location: [facility] ORS;  Service:               General;  Laterality: N/A; No date: WRIST SURGERY     Reproductive/Obstetrics negative OB ROS  Anesthesia Physical Anesthesia Plan  ASA: 3  Anesthesia Plan: General ETT   Post-op Pain Management:    Induction: Intravenous  PONV Risk Score and Plan: Propofol infusion and TIVA  Airway Management Planned: Oral ETT and Video Laryngoscope Planned  Additional Equipment:   Intra-op Plan:   Post-operative Plan: Extubation in OR  Informed Consent: I have reviewed the patients History and Physical, chart, labs and discussed the procedure including the risks, benefits and alternatives for the proposed anesthesia with the patient or authorized representative who has indicated his/her understanding and acceptance.     Dental Advisory Given  Plan Discussed with: Anesthesiologist, CRNA and Surgeon  Anesthesia Plan Comments: (Patient consented for risks of anesthesia including but not limited to:  - adverse reactions  to medications - damage to eyes, teeth, lips or other oral mucosa - nerve damage due to positioning  - sore throat or hoarseness - Damage to heart, brain, nerves, lungs, other parts of body or loss of life  Patient voiced understanding and assent.)       Anesthesia Quick Evaluation

## 2023-07-08 LAB — SURGICAL PATHOLOGY

## 2023-07-08 NOTE — Anesthesia Postprocedure Evaluation (Signed)
Anesthesia Post Note  Patient: Theresa Dodson  Procedure(s) Performed: CT RENAL TUMOR ABLATION UNILATERAL CT RENAL BIOPSY  Patient location during evaluation: PACU Anesthesia Type: General Level of consciousness: awake and alert Pain management: pain level controlled Vital Signs Assessment: post-procedure vital signs reviewed and stable Respiratory status: spontaneous breathing, nonlabored ventilation, respiratory function stable and patient connected to nasal cannula oxygen Cardiovascular status: blood pressure returned to baseline and stable Postop Assessment: no apparent nausea or vomiting Anesthetic complications: yes   Encounter Notable Events  Notable Event Outcome Phase Comment  Difficult to intubate - expected  Intraprocedure Filed from anesthesia note documentation.     Last Vitals:  Vitals:   07/07/23 1530 07/07/23 1545  BP: (!) 130/47 (!) 132/51  Pulse: 93 89  Resp: (!) 23 17  Temp:    SpO2: 97% 96%    Last Pain:  Vitals:   07/07/23 1545  TempSrc:   PainSc: 0-No pain                 Yevette Edwards

## 2023-07-15 ENCOUNTER — Other Ambulatory Visit: Payer: Self-pay | Admitting: Family Medicine

## 2023-07-15 DIAGNOSIS — Z1231 Encounter for screening mammogram for malignant neoplasm of breast: Secondary | ICD-10-CM

## 2023-07-22 ENCOUNTER — Ambulatory Visit
Admission: RE | Admit: 2023-07-22 | Discharge: 2023-07-22 | Disposition: A | Discharge status: Home / Self Care | Payer: Medicare Other | Source: Ambulatory Visit | Attending: Radiology | Admitting: Radiology

## 2023-07-22 DIAGNOSIS — N2889 Other specified disorders of kidney and ureter: Secondary | ICD-10-CM

## 2023-07-22 HISTORY — PX: IR RADIOLOGIST EVAL & MGMT: IMG5224

## 2023-07-22 NOTE — Progress Notes (Signed)
Chief Complaint: Patient was consulted remotely today (TeleHealth) for No chief complaint on file.  at the request of Omohundro,Jennifer C.    Referring Physician(s): Omohundro,Jennifer C  History of Present Illness: Theresa Dodson is a 77 y.o. female referred at the kind request of Dr. Apolinar Junes for evaluation of an enlarging enhancing mass arising from the superolateral aspect of the upper pole of the left kidney.  The mass was initially identified incidentally on renal ultrasound for workup of chronic kidney disease.  This subsequently led to an MRI performed in February 2023 demonstrating a 2.1 cm solid enhancing renal mass.  Surveillance MRI imaging performed in September 2023 demonstrated no significant interval change with a 2.2 cm enhancing renal mass.  Further surveillance imaging with follow-up MRI 1 year later in September 2024 demonstrates a demonstrable enlargement to a maximal diameter of 2.6 cm.   On imaging, the mass is in close proximity to the tail of the pancreas and the inferior pole of the spleen.  No evidence of renal vein invasion, lymphadenopathy or metastatic disease.  Due to her underlying age and comorbidities, she was not an optimal candidate for surgery.   She underwent a successful combined CT guided biopsy and microwave ablation on 07/07/23.   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    Her recovery has been uneventful, her only mild symptom was a sore throat from the endotracheal tube.  She has had no flank/abdominal pain, symptoms of pancreatitis or other issues.  She said she feels great and amazed at the ease of recovery.   Past Medical History:  Diagnosis Date   Aortic atherosclerosis (HCC)    Breast cancer (HCC)    benign   CAD (coronary artery disease)    a.) cCTA 02/17/2023: Ca2+ 80.9 (49th %ile)   Chronic renal insufficiency    Complication of anesthesia    difficult intubation   Difficult intubation    Diverticulitis    Generalized  osteoarthritis    GERD (gastroesophageal reflux disease)    Gout    Gout    History of hiatal hernia    Hypercholesteremia    Hypertension    Left renal mass    Long-term use of aspirin therapy    OSA on CPAP    Skin cancer    T2DM (type 2 diabetes mellitus) (HCC)     Past Surgical History:  Procedure Laterality Date   breast benign mass removal  2012   BREAST BIOPSY Right 07/2011   NEG   BREAST BIOPSY Right 11/17/2020   U/ S bx, Q,  marker ANGIOLIPOMA. - NEGATIVE FOR MALIGNANCY.   BREAST BIOPSY Right 04/03/2021   stereo calcs, x marker, path pending   CARPAL TUNNEL RELEASE Bilateral    CATARACT EXTRACTION Bilateral    CHOLECYSTECTOMY     COLONOSCOPY WITH PROPOFOL N/A 06/26/2017   Procedure: COLONOSCOPY WITH PROPOFOL;  Surgeon: Christena Deem, MD;  Location: [facility] ENDOSCOPY;  Service: Endoscopy;  Laterality: N/A;   FOOT SURGERY     d/t gout   INCISIONAL HERNIA REPAIR N/A 08/13/2017   Procedure: HERNIA REPAIR INCISIONAL;  Surgeon: Ricarda Frame, MD;  Location: [facility] ORS;  Service: General;  Laterality: N/A;   IR RADIOLOGIST EVAL & MGMT  06/24/2023   MANDIBLE SURGERY     4 surgeries   tooth abscess     UMBILICAL HERNIA REPAIR N/A 08/13/2017   Procedure: HERNIA REPAIR UMBILICAL ADULT;  Surgeon: Ricarda Frame, MD;  Location: [facility] ORS;  Service:  General;  Laterality: N/A;   WRIST SURGERY      Allergies: Codeine  Medications: Prior to Admission medications   Medication Sig Start Date End Date Taking? Authorizing Provider  acetaminophen (TYLENOL) 650 MG CR tablet Take 650 mg by mouth 2 (two) times daily.    [provider]  allopurinol (ZYLOPRIM) 100 MG tablet Take 100 mg by mouth at bedtime.     [provider]  allopurinol (ZYLOPRIM) 300 MG tablet Take 300 mg by mouth daily.    [provider]  amLODipine (NORVASC) 5 MG tablet Take 5 mg by mouth daily.    [provider]  aspirin EC 81 MG tablet Take 81 mg by mouth at bedtime.   Patient not taking: Reported on 07/07/2023    [provider]  atorvastatin (LIPITOR) 10 MG tablet Take 10 mg by mouth at bedtime.     [provider]  benzonatate (TESSALON) 200 MG capsule Take 1 capsule (200 mg total) by mouth 3 (three) times daily as needed for cough. Patient not taking: Reported on 07/07/2023 06/14/22   Domenick Gong, MD  colchicine 0.6 MG tablet Take 0.6 mg by mouth daily as needed (pain).     [provider]  cyanocobalamin 1000 MCG tablet Take 1,000 mcg by mouth daily.     [provider]  docusate sodium (COLACE) 100 MG capsule Take 100 mg by mouth daily.    [provider]  famotidine (PEPCID) 20 MG tablet Take 20 mg by mouth 2 (two) times daily as needed for heartburn.     [provider]  FARXIGA 10 MG TABS tablet Take 10 mg by mouth every morning. 10/18/21   [provider]  gabapentin (NEURONTIN) 600 MG tablet Take 600 mg by mouth as needed.    [provider]  guaiFENesin (MUCINEX) 600 MG 12 hr tablet Take 600 mg by mouth daily as needed for cough or to loosen phlegm (allergies).    [provider]  hydrochlorothiazide (HYDRODIURIL) 25 MG tablet Take 25 mg by mouth as needed.    [provider]  ipratropium (ATROVENT) 0.06 % nasal spray Place 2 sprays into both nostrils 4 (four) times daily. 06/14/22   Domenick Gong, MD  lisinopril (PRINIVIL,ZESTRIL) 20 MG tablet  08/11/17   [provider]  omega-3 acid ethyl esters (LOVAZA) 1 G capsule Take 2 g by mouth 2 (two) times daily.     [provider]  Probiotic Product (PROBIOTIC PO) Take 1 tablet by mouth daily.    [provider]  promethazine-dextromethorphan (PROMETHAZINE-DM) 6.25-15 MG/5ML syrup Take 5 mLs by mouth 4 (four) times daily as needed for cough. 06/14/22   Domenick Gong, MD     Family History  Problem Relation Age of Onset   Ovarian cancer Mother    Stroke Father     Hypertension Sister    Heart disease Maternal Grandmother    Heart disease Maternal Grandfather    Heart disease Paternal Grandmother    Diabetes Paternal Grandfather    Breast cancer Neg Hx     Social History   Socioeconomic History   Marital status: Widowed    Spouse name: Not on file   Number of children: Not on file   Years of education: Not on file   Highest education level: Not on file  Occupational History   Not on file  Tobacco Use   Smoking status: Never   Smokeless tobacco: Never  Vaping Use   Vaping  status: Never Used  Substance and Sexual Activity   Alcohol use: No   Drug use: No   Sexual activity: Not on file  Other Topics Concern   Not on file  Social History Narrative   Lives alone, great niece lives with her    Social Determinants of Health   Financial Resource Strain: Low Risk  (07/09/2023)   Received from [facility]   Overall Financial Resource Strain (CARDIA)    Difficulty of Paying Living Expenses: Not very hard  Food Insecurity: No Food Insecurity (07/09/2023)   Received from [facility]   Hunger Vital Sign    Worried About Running Out of Food in the Last Year: Never true    Ran Out of Food in the Last Year: Never true  Transportation Needs: No Transportation Needs (07/09/2023)   Received from [facility] - Transportation    In the past 12 months, has lack of transportation kept you from medical appointments or from getting medications?: No    Lack of Transportation (Non-Medical): No  Physical Activity: Not on file  Stress: Not on file  Social Connections: Not on file    ECOG Status: 0 - Asymptomatic  Review of Systems  Review of Systems: A 12 point ROS discussed and pertinent positives are indicated in the HPI above.  All other systems are negative.  Advance Care Plan: The advanced care plan/surrogate decision maker was discussed at the time of visit and the patient did  not wish to discuss or was not able to name a surrogate decision maker or provide an advance care plan.    Physical Exam No direct physical exam was performed (except for noted visual exam findings with Video Visits).    Vital Signs: There were no vitals taken for this visit.  Imaging: CT RENAL TUMOR ABLATION UNILATERAL  Result Date: 07/07/2023 INDICATION: Extremely pleasant 77 year old female with an enlarging enhancing exophytic mass from the upper pole of the left kidney highly concerning for renal cell carcinoma. She presents for CT-guided microwave ablation and concurrent core biopsy. EXAM: 1. CT-guided core biopsy of left renal mass 2. CT-guided microwave ablation of left renal mass COMPARISON:  MRI of the abdomen 05/01/2023 MEDICATIONS: None. ANESTHESIA/SEDATION: General - as administered by the Anesthesia department FLUOROSCOPY: None. COMPLICATIONS: None immediate. TECHNIQUE: Informed written consent was obtained from the patient after a thorough discussion of the procedural risks, benefits and alternatives. All questions were addressed. Maximal Sterile Barrier Technique was utilized including caps, mask, sterile gowns, sterile gloves, sterile drape, hand hygiene and skin antiseptic. A timeout was performed prior to the initiation of the procedure. Initial planning axial CT imaging was performed. Exophytic mass is identified from the superolateral aspect of the left kidney. The mass measures approximately 2.7 x 2.7 cm. Suitable skin entry site to allow percutaneous ablation, hydro dissection and biopsy were selected and marked. The skin was sterilely prepped and draped in the standard fashion using chlorhexidine skin prep. Small dermatotomies were made. First, the hydro dissection needle placement was performed. Under intermittent CT guidance, an 18 gauge 15 cm trocar needle was advanced over the rib and into the fat plane between the lower pole of the spleen, the tail of the pancreas and the  superior aspect of the renal mass. Once the needle was in position, attention was turned to the renal mass. A NeuWave 15 cm PR XT probe was selected. Under intermittent CT guidance, the probe was advanced and positioned  so that it appears to the lesion where it abuts the renal parenchyma. Positioning was adjusted several times in till satisfactory. A small amount of bleeding occurred into the perinephric fat. Once the needle position was in confirm, attention was turned to the biopsy portion of the exam. Through the third dermatotomy, an 18 x 10 trocar needle was carefully advanced and inserted into the margin of the mass. 218 gauge core biopsies were then obtained. Biopsy specimens were placed in formalin and delivered to pathology for further analysis. Hydro dissection was then performed through the previously placed 18 gauge trocar needle. Normal saline was infused into the retroperitoneal space. Follow-up CT imaging confirms in adequate rim of fluid now separating the exophytic renal mass from the adjacent pancreas and spleen. Percutaneous microwave ablation was then performed. The micro wave probe was powered at 65 watts and ablation performed for a total of 8 minutes. Ablation progress was assessed intermittently. There is excellent contraction of the renal mass. The microwave probe was pulled back slightly during the course of the ablation. After the ablation was completed, the hydro dissection needle in the micro ablation needle were both removed. Triple phase CT imaging was performed. There appears to be complete devascularization of the previously identified enhancing mass with a good margin. Small volume perinephric hematoma as expected. No evidence of active bleeding. 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 No immediate complication. FINDINGS: As above. IMPRESSION: 1. Successful CT-guided biopsy of a left exophytic renal mass. 2. Successful CT-guided microwave ablation of left renal mass.  Electronically Signed   By: Malachy Moan M.D.   On: 07/07/2023 13:09   CT RENAL BIOPSY  Result Date: 07/07/2023 INDICATION: Extremely pleasant 77 year old female with an enlarging enhancing exophytic mass from the upper pole of the left kidney highly concerning for renal cell carcinoma. She presents for CT-guided microwave ablation and concurrent core biopsy. EXAM: 1. CT-guided core biopsy of left renal mass 2. CT-guided microwave ablation of left renal mass COMPARISON:  MRI of the abdomen 05/01/2023 MEDICATIONS: None. ANESTHESIA/SEDATION: General - as administered by the Anesthesia department FLUOROSCOPY: None. COMPLICATIONS: None immediate. TECHNIQUE: Informed written consent was obtained from the patient after a thorough discussion of the procedural risks, benefits and alternatives. All questions were addressed. Maximal Sterile Barrier Technique was utilized including caps, mask, sterile gowns, sterile gloves, sterile drape, hand hygiene and skin antiseptic. A timeout was performed prior to the initiation of the procedure. Initial planning axial CT imaging was performed. Exophytic mass is identified from the superolateral aspect of the left kidney. The mass measures approximately 2.7 x 2.7 cm. Suitable skin entry site to allow percutaneous ablation, hydro dissection and biopsy were selected and marked. The skin was sterilely prepped and draped in the standard fashion using chlorhexidine skin prep. Small dermatotomies were made. First, the hydro dissection needle placement was performed. Under intermittent CT guidance, an 18 gauge 15 cm trocar needle was advanced over the rib and into the fat plane between the lower pole of the spleen, the tail of the pancreas and the superior aspect of the renal mass. Once the needle was in position, attention was turned to the renal mass. A NeuWave 15 cm PR XT probe was selected. Under intermittent CT guidance, the probe was advanced and positioned so that it appears  to the lesion where it abuts the renal parenchyma. Positioning was adjusted several times in till satisfactory. A small amount of bleeding occurred into the perinephric fat. Once the needle position was  in confirm, attention was turned to the biopsy portion of the exam. Through the third dermatotomy, an 18 x 10 trocar needle was carefully advanced and inserted into the margin of the mass. 218 gauge core biopsies were then obtained. Biopsy specimens were placed in formalin and delivered to pathology for further analysis. Hydro dissection was then performed through the previously placed 18 gauge trocar needle. Normal saline was infused into the retroperitoneal space. Follow-up CT imaging confirms in adequate rim of fluid now separating the exophytic renal mass from the adjacent pancreas and spleen. Percutaneous microwave ablation was then performed. The micro wave probe was powered at 65 watts and ablation performed for a total of 8 minutes. Ablation progress was assessed intermittently. There is excellent contraction of the renal mass. The microwave probe was pulled back slightly during the course of the ablation. After the ablation was completed, the hydro dissection needle in the micro ablation needle were both removed. Triple phase CT imaging was performed. There appears to be complete devascularization of the previously identified enhancing mass with a good margin. Small volume perinephric hematoma as expected. No evidence of active bleeding. 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 No immediate complication. FINDINGS: As above. IMPRESSION: 1. Successful CT-guided biopsy of a left exophytic renal mass. 2. Successful CT-guided microwave ablation of left renal mass. Electronically Signed   By: Malachy Moan M.D.   On: 07/07/2023 13:09   IR Radiologist Eval & Mgmt  Result Date: 06/24/2023 EXAM: NEW PATIENT OFFICE VISIT CHIEF COMPLAINT: SEE NOTE IN EPIC HISTORY OF PRESENT ILLNESS: SEE NOTE IN  EPIC REVIEW OF SYSTEMS: SEE NOTE IN EPIC PHYSICAL EXAMINATION: SEE NOTE IN EPIC ASSESSMENT AND PLAN: SEE NOTE IN EPIC Electronically Signed   By: Malachy Moan M.D.   On: 06/24/2023 12:06    Labs:  CBC: Recent Labs    01/16/23 1504 07/04/23 1337  WBC 5.6 6.8  HGB 14.8 15.3*  HCT 47.2* 46.8*  PLT 224 206    COAGS: Recent Labs    07/04/23 1337  INR 0.9    BMP: Recent Labs    01/16/23 1504 07/04/23 1337  NA 140 140  K 4.9 3.7  CL 106 104  CO2 24 26  GLUCOSE 110* 95  BUN 28* 21  CALCIUM 9.8 9.1  CREATININE 1.36* 1.03*  GFRNONAA 40* 56*    LIVER FUNCTION TESTS: No results for input(s): "BILITOT", "AST", "ALT", "ALKPHOS", "PROT", "ALBUMIN" in the last 8760 hours.  TUMOR MARKERS: No results for input(s): "AFPTM", "CEA", "CA199", "CHROMGRNA" in the last 8760 hours.  Assessment and Plan:  77 year-old female doing extremely well 2 weeks post CT guided biopsy and MWA of left renal mass.  Biopsy results positive for WHO grade 2 clear cell renal carcinoma.  No issues with recovery.   1.) Return to full activities without restriction 2.) F/U MRI with gadolinium and clinic visit in 6 months    Electronically Signed: Sterling Big 07/22/2023, 8:40 AM   I spent a total of  15 Minutes in remote  clinical consultation, greater than 50% of which was counseling/coordinating care for left renal cell carcinoma.    Visit type: Audio only (telephone). Audio (no video) only due to patient preference. Alternative for in-person consultation at [facility], [address] [place], [place], [place]. This visit type was conducted due to national recommendations for restrictions regarding the COVID-19 Pandemic (e.g. social distancing).  This format is felt to be most appropriate for this patient at this  time.  All issues noted in this document were discussed and addressed.

## 2023-07-24 ENCOUNTER — Ambulatory Visit
Admission: RE | Admit: 2023-07-24 | Discharge: 2023-07-24 | Disposition: A | Discharge status: Home / Self Care | Payer: Medicare Other | Source: Ambulatory Visit | Attending: Family Medicine | Admitting: Family Medicine

## 2023-07-24 DIAGNOSIS — Z1231 Encounter for screening mammogram for malignant neoplasm of breast: Secondary | ICD-10-CM | POA: Insufficient documentation

## 2023-08-25 ENCOUNTER — Ambulatory Visit
Admission: EM | Admit: 2023-08-25 | Discharge: 2023-08-25 | Disposition: A | Discharge status: Home / Self Care | Payer: Medicare Other | Attending: Emergency Medicine | Admitting: Emergency Medicine

## 2023-08-25 DIAGNOSIS — J069 Acute upper respiratory infection, unspecified: Secondary | ICD-10-CM | POA: Diagnosis not present

## 2023-08-25 MED ORDER — PROMETHAZINE-DM 6.25-15 MG/5ML PO SYRP: 5.0000 mL | ORAL_SOLUTION | Freq: Four times a day (QID) | ORAL | 0 refills | Status: DC | PRN

## 2023-08-25 MED ORDER — IPRATROPIUM BROMIDE 0.06 % NA SOLN: 2.0000 | Freq: Four times a day (QID) | NASAL | 12 refills | Status: DC

## 2023-08-25 MED ORDER — BENZONATATE 100 MG PO CAPS: 200.0000 mg | ORAL_CAPSULE | Freq: Three times a day (TID) | ORAL | 0 refills | Status: DC

## 2023-08-25 NOTE — Discharge Instructions (Addendum)
 As we discussed, your exam is consistent with a viral upper respiratory infection.  I would encourage wearing a mask around others for another day in the event that this is COVID.  After that you do not need to continue masking around others if you did not want to, though I would advise it to prevent contracting any respiratory illness.  Use over-the-counter Tylenol  and/or ibuprofen according the package instructions as needed for any fever or bodyaches.  Use the Atrovent  nasal spray, 2 squirts in each nostril every 6 hours, as needed for runny nose and postnasal drip.  Use the Tessalon  Perles every 8 hours during the day.  Take them with a small sip of water.  They may give you some numbness to the base of your tongue or a metallic taste in your mouth, this is normal.  Use the Promethazine  DM cough syrup at bedtime for cough and congestion.  It will make you drowsy so do not take it during the day.  Return for reevaluation or see your primary care provider for any new or worsening symptoms such as, fever, rusty sputum production, shortness of breath, or wheezing.

## 2023-08-25 NOTE — ED Triage Notes (Signed)
 Pt c/o cough & congestion x4 days. Has tried dayquil & nyquil w/o relief.

## 2023-08-25 NOTE — ED Provider Notes (Signed)
 [facility]    CSN: [id number] Arrival date & time: 08/25/23  9188      History   Chief Complaint Chief Complaint  Patient presents with   Cough   Congestion    HPI Theresa Dodson is a 78 y.o. female.   HPI  78 year old female with past medical history significant for type 2 diabetes, OSA on CPAP, left renal mass, hypertension, high cholesterol, gout, GERD, diverticulosis, chronic renal insufficiency, CAD, benign breast cancer, and aortic atherosclerosis presents for evaluation of 4 days worth of respiratory symptoms to include nasal congestion with a slight runny nose for clear nasal discharge, right ear pain, and a cough that is intermittently productive for clear sputum.  She denies any fever, sore throat, shortness breath, or wheezing.  Past Medical History:  Diagnosis Date   Aortic atherosclerosis (HCC)    Breast cancer (HCC)    benign   CAD (coronary artery disease)    a.) cCTA 02/17/2023: Ca2+ 80.9 (49th %ile)   Chronic renal insufficiency    Complication of anesthesia    difficult intubation   Difficult intubation    Diverticulitis    Generalized osteoarthritis    GERD (gastroesophageal reflux disease)    Gout    Gout    History of hiatal hernia    Hypercholesteremia    Hypertension    Left renal mass    Long-term use of aspirin therapy    OSA on CPAP    Skin cancer    T2DM (type 2 diabetes mellitus) (HCC)     Patient Active Problem List   Diagnosis Date Noted   Incisional hernia, without obstruction or gangrene    Type 2 diabetes mellitus without complication, without long-term current use of insulin (HCC) 08/05/2017   Allergic rhinitis 07/29/2017   Back pain 07/29/2017   Generalized osteoarthrosis, unspecified site 07/29/2017   Hx of diverticulitis of colon 07/29/2017   Hyperlipidemia, unspecified 07/29/2017   CKD (chronic kidney disease) stage 3, GFR 30-59 ml/min (HCC) 06/19/2014   Hyperglycemia, unspecified 06/19/2014   OSA on CPAP  05/07/2013   SCIATICA, RIGHT 05/14/2010   Pure hypercholesterolemia 04/05/2010   KNEE PAIN, RIGHT 02/26/2010   BACK PAIN, LUMBAR, WITH RADICULOPATHY 02/09/2010   GOUT 11/17/2009   MOURNING 03/28/2009   SKIN LESION 03/28/2009   OTHER SPECIFIED DISORDER OF SKIN 02/14/2009   UNSPECIFIED HYPOTHYROIDISM 12/20/2008   ONYCHOMYCOSIS, BILATERAL 11/15/2008   HYPERTENSION 07/05/2008   GERD 07/05/2008    Past Surgical History:  Procedure Laterality Date   breast benign mass removal  2012   BREAST BIOPSY Right 07/2011   NEG   BREAST BIOPSY Right 11/17/2020   U/ S bx, Q,  marker ANGIOLIPOMA. - NEGATIVE FOR MALIGNANCY.   BREAST BIOPSY Right 04/03/2021   stereo calcs, x marker, path pending   CARPAL TUNNEL RELEASE Bilateral    CATARACT EXTRACTION Bilateral    CHOLECYSTECTOMY     COLONOSCOPY WITH PROPOFOL  N/A 06/26/2017   Procedure: COLONOSCOPY WITH PROPOFOL ;  Surgeon: Gaylyn Gladis PENNER, MD;  Location: [facility];  Service: Endoscopy;  Laterality: N/A;   FOOT SURGERY     d/t gout   INCISIONAL HERNIA REPAIR N/A 08/13/2017   Procedure: HERNIA REPAIR INCISIONAL;  Surgeon: Shelva Dunnings, MD;  Location: [facility];  Service: General;  Laterality: N/A;   IR RADIOLOGIST EVAL & MGMT  06/24/2023   IR RADIOLOGIST EVAL & MGMT  07/22/2023   MANDIBLE SURGERY     4 surgeries   tooth abscess  UMBILICAL HERNIA REPAIR N/A 08/13/2017   Procedure: HERNIA REPAIR UMBILICAL ADULT;  Surgeon: Shelva Dunnings, MD;  Location: [facility];  Service: General;  Laterality: N/A;   WRIST SURGERY      OB History   No obstetric history on file.      Home Medications    Prior to Admission medications   Medication Sig Start Date End Date Taking? Authorizing Provider  acetaminophen  (TYLENOL ) 650 MG CR tablet Take 650 mg by mouth 2 (two) times daily.   Yes [provider]  allopurinol (ZYLOPRIM) 100 MG tablet Take 100 mg by mouth at bedtime.    Yes [provider]  allopurinol (ZYLOPRIM) 300  MG tablet Take 300 mg by mouth daily.   Yes [provider]  amLODipine (NORVASC) 5 MG tablet Take 5 mg by mouth daily.   Yes [provider]  atorvastatin (LIPITOR) 10 MG tablet Take 10 mg by mouth at bedtime.    Yes [provider]  benzonatate  (TESSALON ) 100 MG capsule Take 2 capsules (200 mg total) by mouth every 8 (eight) hours. 08/25/23  Yes Bernardino Ditch, NP  colchicine 0.6 MG tablet Take 0.6 mg by mouth daily as needed (pain).    Yes [provider]  docusate sodium (COLACE) 100 MG capsule Take 100 mg by mouth daily.   Yes [provider]  famotidine (PEPCID) 20 MG tablet Take 20 mg by mouth 2 (two) times daily as needed for heartburn.    Yes [provider]  FARXIGA 10 MG TABS tablet Take 10 mg by mouth every morning. 10/18/21  Yes [provider]  gabapentin (NEURONTIN) 600 MG tablet Take 600 mg by mouth as needed.   Yes [provider]  guaiFENesin (MUCINEX) 600 MG 12 hr tablet Take 600 mg by mouth daily as needed for cough or to loosen phlegm (allergies).   Yes [provider]  hydrochlorothiazide (HYDRODIURIL) 25 MG tablet Take 25 mg by mouth as needed.   Yes [provider]  ipratropium (ATROVENT ) 0.06 % nasal spray Place 2 sprays into both nostrils 4 (four) times daily. 08/25/23  Yes Bernardino Ditch, NP  omega-3 acid ethyl esters (LOVAZA) 1 G capsule Take 2 g by mouth 2 (two) times daily.    Yes [provider]  Probiotic Product (PROBIOTIC PO) Take 1 tablet by mouth daily.   Yes [provider]  promethazine -dextromethorphan (PROMETHAZINE -DM) 6.25-15 MG/5ML syrup Take 5 mLs by mouth 4 (four) times daily as needed. 08/25/23  Yes Bernardino Ditch, NP  aspirin EC 81 MG tablet Take 81 mg by mouth at bedtime.  Patient not taking: Reported on 07/07/2023    [provider]  cyanocobalamin 1000 MCG tablet Take 1,000 mcg by mouth daily.     [provider]  lisinopril  (PRINIVIL,ZESTRIL) 20 MG tablet  08/11/17   [provider]    Family History Family History  Problem Relation Age of Onset   Ovarian cancer Mother    Stroke Father    Hypertension Sister    Heart disease Maternal Grandmother    Heart disease Maternal Grandfather    Heart disease Paternal Grandmother    Diabetes Paternal Grandfather    Breast cancer Neg Hx     Social History Social History   Tobacco Use   Smoking status: Never   Smokeless tobacco: Never  Vaping Use   Vaping status: Never Used  Substance Use Topics   Alcohol use: No   Drug use: No  Allergies   Codeine   Review of Systems Review of Systems  Constitutional:  Negative for fever.  HENT:  Positive for congestion, ear pain and rhinorrhea. Negative for sore throat.   Respiratory:  Positive for cough. Negative for shortness of breath and wheezing.      Physical Exam Triage Vital Signs ED Triage Vitals  Encounter Vitals Group     BP 08/25/23 0817 (!) 165/79     Systolic BP Percentile --      Diastolic BP Percentile --      Pulse Rate 08/25/23 0817 73     Resp 08/25/23 0817 16     Temp 08/25/23 0817 98.6 F (37 C)     Temp Source 08/25/23 0817 Oral     SpO2 08/25/23 0817 96 %     Weight 08/25/23 0816 203 lb (92.1 kg)     Height 08/25/23 0816 5' 2 (1.575 m)     Head Circumference --      Peak Flow --      Pain Score 08/25/23 0820 0     Pain Loc --      Pain Education --      Exclude from Growth Chart --    No data found.  Updated Vital Signs BP (!) 165/79 (BP Location: Left Arm)   Pulse 73   Temp 98.6 F (37 C) (Oral)   Resp 16   Ht 5' 2 (1.575 m)   Wt 203 lb (92.1 kg)   SpO2 96%   BMI 37.13 kg/m   Visual Acuity Right Eye Distance:   Left Eye Distance:   Bilateral Distance:    Right Eye Near:   Left Eye Near:    Bilateral Near:     Physical Exam Vitals and nursing note reviewed.  Constitutional:      Appearance: Normal appearance. She is not ill-appearing.   HENT:     Head: Normocephalic and atraumatic.     Right Ear: Tympanic membrane, ear canal and external ear normal. There is no impacted cerumen.     Left Ear: Tympanic membrane, ear canal and external ear normal. There is no impacted cerumen.     Ears:     Comments: Both external auditory canals are moderately ceruminous.  I am able to visualize a small portion of both tympanic membranes which are pearly gray in appearance.    Nose: Congestion and rhinorrhea present.     Comments: Patient mucosa is erythematous and edematous with scant clear discharge in both nares.    Mouth/Throat:     Mouth: Mucous membranes are moist.     Pharynx: Oropharynx is clear. Posterior oropharyngeal erythema present. No oropharyngeal exudate.     Comments: Mild erythema to the posterior oropharynx with clear postnasal drip. Cardiovascular:     Rate and Rhythm: Normal rate and regular rhythm.     Pulses: Normal pulses.     Heart sounds: Normal heart sounds. No murmur heard.    No friction rub. No gallop.  Pulmonary:     Effort: Pulmonary effort is normal.     Breath sounds: Normal breath sounds. No wheezing, rhonchi or rales.  Musculoskeletal:     Cervical back: Normal range of motion and neck supple. No tenderness.  Lymphadenopathy:     Cervical: No cervical adenopathy.  Skin:    General: Skin is warm and dry.     Capillary Refill: Capillary refill takes less than 2 seconds.     Findings: No rash.  Neurological:  General: No focal deficit present.     Mental Status: She is alert and oriented to person, place, and time.      UC Treatments / Results  Labs (all labs ordered are listed, but only abnormal results are displayed) Labs Reviewed - No data to display  EKG   Radiology No results found.  Procedures Procedures (including critical care time)  Medications Ordered in UC Medications - No data to display  Initial Impression / Assessment and Plan / UC Course  I have reviewed the  triage vital signs and the nursing notes.  Pertinent labs & imaging results that were available during my care of the patient were reviewed by me and considered in my medical decision making (see chart for details).   Patient is a nontoxic-appearing 78 year old female presenting for evaluation of 4 days with respiratory symptoms as outlined HPI above.  She is able to speak in full sentence without dyspnea or tachypnea and her room air oxygen saturation is 96%.  Respiratory rate at triage was 16 and she is afebrile with an oral temp of 98.6.  Talking does trigger a slight, nonproductive cough.  Physical exam does reveal inflammation of her upper respiratory tract with inflamed nasal mucosa and clear rhinorrhea.  There is also erythema to the posterior oropharynx with clear postnasal drip.  Cardiopulmonary exam is benign.  Her exam is consistent with a viral URI.  She is outside the therapeutic window for antivirals for influenza and she is on day 4 of symptoms.  We discussed testing for COVID though it would not change management going forward.  I will discharge the patient home on Atrovent  nasal spray over the nasal congestion postnasal drip as well as Tessalon  Perles and Promethazine  DM cough syrup for cough and congestion.  Return precautions reviewed.  Final diagnoses:  Viral URI with cough     Discharge Instructions      As we discussed, your exam is consistent with a viral upper respiratory infection.  I would encourage wearing a mask around others for another day in the event that this is COVID.  After that you do not need to continue masking around others if you did not want to, though I would advise it to prevent contracting any respiratory illness.  Use over-the-counter Tylenol  and/or ibuprofen according the package instructions as needed for any fever or bodyaches.  Use the Atrovent  nasal spray, 2 squirts in each nostril every 6 hours, as needed for runny nose and postnasal drip.  Use  the Tessalon  Perles every 8 hours during the day.  Take them with a small sip of water.  They may give you some numbness to the base of your tongue or a metallic taste in your mouth, this is normal.  Use the Promethazine  DM cough syrup at bedtime for cough and congestion.  It will make you drowsy so do not take it during the day.  Return for reevaluation or see your primary care provider for any new or worsening symptoms such as, fever, rusty sputum production, shortness of breath, or wheezing.     ED Prescriptions     Medication Sig Dispense Auth. Provider   benzonatate  (TESSALON ) 100 MG capsule Take 2 capsules (200 mg total) by mouth every 8 (eight) hours. 21 capsule Bernardino Ditch, NP   ipratropium (ATROVENT ) 0.06 % nasal spray Place 2 sprays into both nostrils 4 (four) times daily. 15 mL Bernardino Ditch, NP   promethazine -dextromethorphan (PROMETHAZINE -DM) 6.25-15 MG/5ML syrup Take 5 mLs by mouth  4 (four) times daily as needed. 118 mL Bernardino Ditch, NP      PDMP not reviewed this encounter.   Bernardino Ditch, NP 08/25/23 [phone]

## 2024-04-01 ENCOUNTER — Encounter: Payer: Self-pay | Admitting: Urology

## 2024-04-06 ENCOUNTER — Encounter: Payer: Self-pay | Admitting: Urology

## 2024-04-29 ENCOUNTER — Other Ambulatory Visit: Payer: Self-pay | Admitting: Urology

## 2024-04-29 MED ORDER — DIAZEPAM 5 MG PO TABS: 5.0000 mg | ORAL_TABLET | Freq: Four times a day (QID) | ORAL | 0 refills | Status: AC | PRN

## 2024-04-29 NOTE — Progress Notes (Signed)
 Ordering Valium  for claustrophobia, MRI kidney.

## 2024-05-03 ENCOUNTER — Ambulatory Visit
Admission: RE | Admit: 2024-05-03 | Discharge: 2024-05-03 | Disposition: A | Discharge status: Home / Self Care | Source: Ambulatory Visit | Attending: Urology | Admitting: Urology

## 2024-05-03 DIAGNOSIS — N2889 Other specified disorders of kidney and ureter: Secondary | ICD-10-CM | POA: Insufficient documentation

## 2024-05-03 MED ORDER — GADOBUTROL 1 MMOL/ML IV SOLN
9.0000 mL | Freq: Once | INTRAVENOUS | Status: AC | PRN
  Administered 2024-05-03: 9 mL via INTRAVENOUS

## 2024-05-18 DIAGNOSIS — N2889 Other specified disorders of kidney and ureter: Secondary | ICD-10-CM | POA: Insufficient documentation

## 2024-05-18 NOTE — Assessment & Plan Note (Addendum)
 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 Due Sept 2026 - consider transitioning to every other year RBUS at year 3- may reduce MRI burden.

## 2024-05-18 NOTE — Progress Notes (Signed)
   05/24/2024 2:30 PM   Theresa Dodson Dec 24, 1945 [id number]  Reason for visit: Follow up left ccRCC s/p cyro   HPI: 78 y.o. female, initial follow up with me today, previously seen by Dr. 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   Pertinent Imaging: MRI Abd (05/03/24) -  IMPRESSION: 1. Status post percutaneous ablation of a mass of the peripheral superior pole of the left kidney without evidence of residual or recurrent enhancing disease. 2. No evidence of lymphadenopathy or metastatic disease in the abdomen. 3. Colonic diverticulosis.   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 Due Sept 2026 - consider transitioning to every other year RBUS at year 3- may reduce MRI burden.  Orders: -     MR ABDOMEN W WO CONTRAST; Future  Other specified disorders of kidney and ureter -     MR ABDOMEN W WO CONTRAST; Future       Penne JONELLE Skye, MD  [facility] [address], [address] [place], [place] [address] [phone]

## 2024-05-19 ENCOUNTER — Ambulatory Visit: Payer: Self-pay | Admitting: Urology

## 2024-05-20 ENCOUNTER — Ambulatory Visit: Admitting: Urology

## 2024-05-24 ENCOUNTER — Ambulatory Visit (INDEPENDENT_AMBULATORY_CARE_PROVIDER_SITE_OTHER): Admitting: Urology

## 2024-05-24 VITALS — BP 157/82 | HR 82 | Ht 62.0 in | Wt 210.0 lb

## 2024-05-24 DIAGNOSIS — N2889 Other specified disorders of kidney and ureter: Secondary | ICD-10-CM | POA: Diagnosis not present

## 2024-05-24 NOTE — Patient Instructions (Signed)
 Schedule MRI [id number]

## 2024-06-25 ENCOUNTER — Other Ambulatory Visit: Payer: Self-pay | Admitting: Family Medicine

## 2024-06-25 DIAGNOSIS — Z1231 Encounter for screening mammogram for malignant neoplasm of breast: Secondary | ICD-10-CM

## 2024-07-30 ENCOUNTER — Ambulatory Visit
Admission: RE | Admit: 2024-07-30 | Discharge: 2024-07-30 | Disposition: A | Source: Ambulatory Visit | Attending: Family Medicine | Admitting: Family Medicine

## 2024-07-30 DIAGNOSIS — Z1231 Encounter for screening mammogram for malignant neoplasm of breast: Secondary | ICD-10-CM

## 2024-08-14 ENCOUNTER — Ambulatory Visit
Admission: EM | Admit: 2024-08-14 | Discharge: 2024-08-14 | Disposition: A | Discharge status: Home / Self Care | Attending: Emergency Medicine | Admitting: Emergency Medicine

## 2024-08-14 DIAGNOSIS — J09X2 Influenza due to identified novel influenza A virus with other respiratory manifestations: Secondary | ICD-10-CM | POA: Diagnosis not present

## 2024-08-14 DIAGNOSIS — R051 Acute cough: Secondary | ICD-10-CM | POA: Diagnosis not present

## 2024-08-14 LAB — POCT RESPIRATORY SYNCYTIAL VIRUS: RSV Antigen, POC: NEGATIVE

## 2024-08-14 LAB — POCT INFLUENZA A/B
Influenza A, POC: POSITIVE — AB
Influenza B, POC: NEGATIVE

## 2024-08-14 LAB — POC SOFIA SARS ANTIGEN FIA: SARS Coronavirus 2 Ag: NEGATIVE

## 2024-08-14 MED ORDER — BENZONATATE 100 MG PO CAPS: 200.0000 mg | ORAL_CAPSULE | Freq: Three times a day (TID) | ORAL | 0 refills | Status: AC

## 2024-08-14 MED ORDER — IPRATROPIUM BROMIDE 0.06 % NA SOLN: 2.0000 | Freq: Four times a day (QID) | NASAL | 12 refills | Status: AC

## 2024-08-14 MED ORDER — PROMETHAZINE-DM 6.25-15 MG/5ML PO SYRP: 5.0000 mL | ORAL_SOLUTION | Freq: Four times a day (QID) | ORAL | 0 refills | Status: AC | PRN

## 2024-08-14 MED ORDER — OSELTAMIVIR PHOSPHATE 75 MG PO CAPS: 75.0000 mg | ORAL_CAPSULE | Freq: Two times a day (BID) | ORAL | 0 refills | Status: AC

## 2024-08-14 NOTE — Discharge Instructions (Addendum)
 You have tested positive for influenza A today.  Please use over-the-counter Tylenol  and/or ibuprofen according to the package instructions as needed for any fever or pain.  Take the Tamiflu  twice daily for 5 days for treatment of influenza.  Use the Atrovent  nasal spray, 2 squirts up each nostril every 6 hours, as needed for nasal congestion and runny nose.  Use over-the-counter Delsym, Zarbee's, or Robitussin during the day as needed for cough.  Use the Tessalon  Perles every 8 hours as needed for cough.  Taken with a small sip of water.  You may experience some numbness to your tongue or metallic taste in your mouth, this is normal.  Use the Promethazine  DM cough syrup at bedtime as will make you drowsy but it should help dry up your postnasal drip and aid you in sleep and cough relief.  Return for reevaluation, or see your primary care provider, for new or worsening symptoms.

## 2024-08-14 NOTE — ED Provider Notes (Signed)
 " [facility]    CSN: [id number] Arrival date & time: 08/14/24  9170      History   Chief Complaint Chief Complaint  Patient presents with   Cough   Nasal Congestion    HPI Theresa Dodson is a 78 y.o. female.   HPI  78 year old female with past medical history significant for high cholesterol, hypothyroidism, hypertension, GERD, type 2 diabetes, and stage III chronic kidney disease presents for evaluation of 2 days worth of respiratory symptoms that include nasal congestion and sinus pressure with clear discharge, scratchy throat, and a cough that is intermittently productive for clear sputum.  No fever, ear pain, shortness breath, or wheezing.  No known sick contacts or recent travel out of the state or country.  Cough is worse at night when laying down.  Patient has used over-the-counter medication without any improvement of symptoms.  Past Medical History:  Diagnosis Date   Aortic atherosclerosis    Breast cancer (HCC)    benign   CAD (coronary artery disease)    a.) cCTA 02/17/2023: Ca2+ 80.9 (49th %ile)   Chronic renal insufficiency    Complication of anesthesia    difficult intubation   Difficult intubation    Diverticulitis    Generalized osteoarthritis    GERD (gastroesophageal reflux disease)    Gout    Gout    History of hiatal hernia    Hypercholesteremia    Hypertension    Left renal mass    Long-term use of aspirin therapy    OSA on CPAP    Skin cancer    T2DM (type 2 diabetes mellitus) (HCC)     Patient Active Problem List   Diagnosis Date Noted   Trigger thumb of left hand 06/18/2023   Left kidney mass 10/23/2021   BMI 39.0-39.9,adult 05/28/2021   Hyperplastic colonic polyp 07/03/2020   Incisional hernia, without obstruction or gangrene    Type 2 diabetes mellitus without complication, without long-term current use of insulin (HCC) 08/05/2017   Allergic rhinitis 07/29/2017   Back pain 07/29/2017   Generalized osteoarthrosis,  unspecified site 07/29/2017   Hx of diverticulitis of colon 07/29/2017   Hyperlipidemia 07/29/2017   Stage 3 chronic kidney disease (HCC) 06/19/2014   Hyperglycemia, unspecified 06/19/2014   OSA on CPAP 05/07/2013   Sciatica of right side 05/14/2010   Pure hypercholesterolemia 04/05/2010   KNEE PAIN, RIGHT 02/26/2010   BACK PAIN, LUMBAR, WITH RADICULOPATHY 02/09/2010   Gout 11/17/2009   MOURNING 03/28/2009   SKIN LESION 03/28/2009   Mourning 03/28/2009   OTHER SPECIFIED DISORDER OF SKIN 02/14/2009   UNSPECIFIED HYPOTHYROIDISM 12/20/2008   ONYCHOMYCOSIS, BILATERAL 11/15/2008   HYPERTENSION 07/05/2008   GERD 07/05/2008    Past Surgical History:  Procedure Laterality Date   breast benign mass removal  2012   BREAST BIOPSY Right 07/2011   NEG   BREAST BIOPSY Right 11/17/2020   U/ S bx, Q,  marker ANGIOLIPOMA. - NEGATIVE FOR MALIGNANCY.   BREAST BIOPSY Right 04/03/2021   stereo calcs, x marker, path pending   CARPAL TUNNEL RELEASE Bilateral    CATARACT EXTRACTION Bilateral    CHOLECYSTECTOMY     COLONOSCOPY WITH PROPOFOL  N/A 06/26/2017   Procedure: COLONOSCOPY WITH PROPOFOL ;  Surgeon: Gaylyn Gladis PENNER, MD;  Location: [facility] ENDOSCOPY;  Service: Endoscopy;  Laterality: N/A;   FOOT SURGERY     d/t gout   INCISIONAL HERNIA REPAIR N/A 08/13/2017   Procedure: HERNIA REPAIR INCISIONAL;  Surgeon: Shelva Dunnings, MD;  Location: [facility] ORS;  Service: General;  Laterality: N/A;   IR RADIOLOGIST EVAL & MGMT  06/24/2023   IR RADIOLOGIST EVAL & MGMT  07/22/2023   MANDIBLE SURGERY     4 surgeries   tooth abscess     UMBILICAL HERNIA REPAIR N/A 08/13/2017   Procedure: HERNIA REPAIR UMBILICAL ADULT;  Surgeon: Shelva Dunnings, MD;  Location: [facility] ORS;  Service: General;  Laterality: N/A;   WRIST SURGERY      OB History   No obstetric history on file.      Home Medications    Prior to Admission medications  Medication Sig Start Date End Date Taking? Authorizing Provider  MOUNJARO  2.5 MG/0.5ML Pen ADMINISTER 2.5 MG UNDER THE SKIN 1 TIME A WEEK 07/19/24  Yes [provider]  oseltamivir  (TAMIFLU ) 75 MG capsule Take 1 capsule (75 mg total) by mouth every 12 (twelve) hours. 08/14/24  Yes Bernardino Ditch, NP  acetaminophen  (TYLENOL ) 650 MG CR tablet Take 650 mg by mouth 2 (two) times daily.    [provider]  allopurinol (ZYLOPRIM) 100 MG tablet Take 100 mg by mouth at bedtime.     [provider]  allopurinol (ZYLOPRIM) 300 MG tablet Take 300 mg by mouth daily.    [provider]  amLODipine (NORVASC) 5 MG tablet Take 5 mg by mouth daily.    [provider]  aspirin EC 81 MG tablet Take 81 mg by mouth at bedtime.     [provider]  atorvastatin (LIPITOR) 10 MG tablet Take 10 mg by mouth at bedtime.     [provider]  benzonatate  (TESSALON ) 100 MG capsule Take 2 capsules (200 mg total) by mouth every 8 (eight) hours. 08/14/24   Bernardino Ditch, NP  colchicine 0.6 MG tablet Take 0.6 mg by mouth daily as needed (pain).     [provider]  cyanocobalamin 1000 MCG tablet Take 1,000 mcg by mouth daily.     [provider]  diazepam  (VALIUM ) 5 MG tablet Take 1 tablet (5 mg total) by mouth every 6 (six) hours as needed for anxiety or sedation (pre-MRI sedation). 04/29/24   Georganne Penne SAUNDERS, MD  docusate sodium (COLACE) 100 MG capsule Take 100 mg by mouth daily.    [provider]  famotidine (PEPCID) 20 MG tablet Take 20 mg by mouth 2 (two) times daily as needed for heartburn.     [provider]  FARXIGA 10 MG TABS tablet Take 10 mg by mouth every morning. 10/18/21   [provider]  gabapentin (NEURONTIN) 600 MG tablet Take 600 mg by mouth as needed.    [provider]  guaiFENesin (MUCINEX) 600 MG 12 hr tablet Take 600 mg by mouth daily as needed for cough or to loosen phlegm (allergies).    [provider]  hydrochlorothiazide (HYDRODIURIL) 25 MG tablet Take 25  mg by mouth as needed.    [provider]  ipratropium (ATROVENT ) 0.06 % nasal spray Place 2 sprays into both nostrils 4 (four) times daily. 08/14/24   Bernardino Ditch, NP  lisinopril (PRINIVIL,ZESTRIL) 20 MG tablet  08/11/17   [provider]  omega-3 acid ethyl esters (LOVAZA) 1 G capsule Take 2 g by mouth 2 (two) times daily.     [provider]  Probiotic Product (PROBIOTIC PO) Take 1 tablet by mouth daily.    [provider]  promethazine -dextromethorphan (PROMETHAZINE -DM) 6.25-15 MG/5ML syrup Take 5 mLs by mouth 4 (four) times daily as  needed. 08/14/24   Bernardino Ditch, NP    Family History Family History  Problem Relation Age of Onset   Ovarian cancer Mother    Stroke Father    Hypertension Sister    Heart disease Maternal Grandmother    Heart disease Maternal Grandfather    Heart disease Paternal Grandmother    Diabetes Paternal Grandfather    Breast cancer Neg Hx     Social History Social History[1]   Allergies   Codeine   Review of Systems Review of Systems  Constitutional:  Negative for fever.  HENT:  Positive for congestion, rhinorrhea, sinus pressure and sore throat. Negative for ear pain.   Respiratory:  Positive for cough. Negative for shortness of breath and wheezing.      Physical Exam Triage Vital Signs ED Triage Vitals  Encounter Vitals Group     BP      Girls Systolic BP Percentile      Girls Diastolic BP Percentile      Boys Systolic BP Percentile      Boys Diastolic BP Percentile      Pulse      Resp      Temp      Temp src      SpO2      Weight      Height      Head Circumference      Peak Flow      Pain Score      Pain Loc      Pain Education      Exclude from Growth Chart    No data found.  Updated Vital Signs BP 110/63 (BP Location: Right Arm)   Pulse 90   Temp 98.7 F (37.1 C) (Oral)   Resp 18   SpO2 95%   Visual Acuity Right Eye Distance:   Left Eye Distance:   Bilateral Distance:     Right Eye Near:   Left Eye Near:    Bilateral Near:     Physical Exam Vitals and nursing note reviewed.  Constitutional:      Appearance: Normal appearance. She is not ill-appearing.  HENT:     Head: Normocephalic and atraumatic.     Right Ear: Tympanic membrane, ear canal and external ear normal. There is no impacted cerumen.     Left Ear: Tympanic membrane, ear canal and external ear normal. There is no impacted cerumen.     Ears:     Comments: Bilateral external auditory canals have a large degree of cerumen.  I am able to visualize the superior portion of both tympanic membranes which are pearly gray in appearance.    Nose: Congestion and rhinorrhea present.     Comments: Nasal mucosa is erythematous and edematous with scant clear discharge in both nares.    Mouth/Throat:     Mouth: Mucous membranes are moist.     Pharynx: Oropharynx is clear. Posterior oropharyngeal erythema present. No oropharyngeal exudate.     Comments: Mild erythema to the posterior pharynx with clear postnasal drip. Cardiovascular:     Rate and Rhythm: Normal rate and regular rhythm.     Pulses: Normal pulses.     Heart sounds: Normal heart sounds. No murmur heard.    No friction rub. No gallop.  Pulmonary:     Effort: Pulmonary effort is normal.     Breath sounds: Normal breath sounds. No wheezing, rhonchi or rales.  Musculoskeletal:     Cervical back: Normal range of motion and  neck supple. No tenderness.  Lymphadenopathy:     Cervical: No cervical adenopathy.  Skin:    General: Skin is warm and dry.     Capillary Refill: Capillary refill takes less than 2 seconds.     Findings: No rash.  Neurological:     General: No focal deficit present.     Mental Status: She is alert and oriented to person, place, and time.      UC Treatments / Results  Labs (all labs ordered are listed, but only abnormal results are displayed) Labs Reviewed  POCT INFLUENZA A/B - Abnormal; Notable for the following  components:      Result Value   Influenza A, POC Positive (*)    All other components within normal limits  POC SOFIA SARS ANTIGEN FIA - Normal  POCT RESPIRATORY SYNCYTIAL VIRUS - Normal    EKG   Radiology No results found.  Procedures Procedures (including critical care time)  Medications Ordered in UC Medications - No data to display  Initial Impression / Assessment and Plan / UC Course  I have reviewed the triage vital signs and the nursing notes.  Pertinent labs & imaging results that were available during my care of the patient were reviewed by me and considered in my medical decision making (see chart for details).   Patient is a pleasant, nontoxic-appearing 78 year old female presenting for evaluation 2 days with respiratory symptoms as outlined in HPI above.  She is reporting that her cough is worse at night when she lays down.  She will intermittently bring up clear mucus and she has clear mucus coming from her nose.  She has not run a fever.  She is not in any acute distress and is able to speak in full sentence without dyspnea or tachypnea.  Respiratory rate at triage was 18 with a 95% room air oxygen saturation.  She is afebrile with oral temp of 98.7.  Differential diagnoses include COVID, influenza, RSV, viral respiratory illness.  I will order antigen tests for the above.  Influenza antigen test positive for influenza A.  RSV antigen test negative.  COVID antigen test negative.  I will discharge patient on the diagnosis of influenza A and start her on Tamiflu  75 mg twice daily for 5 days.  Atrovent  nasal spray for nasal congestion.  Tessalon  Perles and Promethazine  DM cough syrup for cough and congestion.  Return to ER precautions reviewed.   Final Clinical Impressions(s) / UC Diagnoses   Final diagnoses:  Acute cough  Influenza due to identified novel influenza A virus with other respiratory manifestations     Discharge Instructions      You have tested  positive for influenza A today.  Please use over-the-counter Tylenol  and/or ibuprofen according to the package instructions as needed for any fever or pain.  Take the Tamiflu  twice daily for 5 days for treatment of influenza.  Use the Atrovent  nasal spray, 2 squirts up each nostril every 6 hours, as needed for nasal congestion and runny nose.  Use over-the-counter Delsym, Zarbee's, or Robitussin during the day as needed for cough.  Use the Tessalon  Perles every 8 hours as needed for cough.  Taken with a small sip of water.  You may experience some numbness to your tongue or metallic taste in your mouth, this is normal.  Use the Promethazine  DM cough syrup at bedtime as will make you drowsy but it should help dry up your postnasal drip and aid you in sleep and cough relief.  Return for reevaluation, or see your primary care provider, for new or worsening symptoms.      ED Prescriptions     Medication Sig Dispense Auth. Provider   benzonatate  (TESSALON ) 100 MG capsule Take 2 capsules (200 mg total) by mouth every 8 (eight) hours. 21 capsule Bernardino Ditch, NP   ipratropium (ATROVENT ) 0.06 % nasal spray Place 2 sprays into both nostrils 4 (four) times daily. 15 mL Bernardino Ditch, NP   promethazine -dextromethorphan (PROMETHAZINE -DM) 6.25-15 MG/5ML syrup Take 5 mLs by mouth 4 (four) times daily as needed. 118 mL Bernardino Ditch, NP   oseltamivir  (TAMIFLU ) 75 MG capsule Take 1 capsule (75 mg total) by mouth every 12 (twelve) hours. 10 capsule Bernardino Ditch, NP      PDMP not reviewed this encounter.     [1]  Social History Tobacco Use   Smoking status: Never   Smokeless tobacco: Never  Vaping Use   Vaping status: Never Used  Substance Use Topics   Alcohol use: No   Drug use: No     Bernardino Ditch, NP 08/14/24 0913  "

## 2024-08-14 NOTE — ED Triage Notes (Signed)
 Pt c/o cough & sinus congestion x2 days. Denies fevers. States cough worse at night. Has tried OTC meds w/o relief.

## 2025-05-23 ENCOUNTER — Ambulatory Visit: Admitting: Urology
